# Patient Record
Sex: Female | Born: 1975 | Race: White | Hispanic: No | Marital: Single | Smoking: Current every day smoker
Health system: Southern US, Community
[De-identification: ages and names within clinical notes are randomized; demographics above are authoritative.]

## PROBLEM LIST (undated history)

## (undated) DIAGNOSIS — C539 Malignant neoplasm of cervix uteri, unspecified: Secondary | ICD-10-CM

## (undated) DIAGNOSIS — J45909 Unspecified asthma, uncomplicated: Secondary | ICD-10-CM

## (undated) HISTORY — PX: ARTERY REPAIR: SHX559

## (undated) HISTORY — PX: ABDOMINAL HYSTERECTOMY: SHX81

---

## 2001-02-25 ENCOUNTER — Encounter: Payer: Self-pay | Admitting: Emergency Medicine

## 2001-02-25 ENCOUNTER — Emergency Department (HOSPITAL_COMMUNITY): Admission: EM | Admit: 2001-02-25 | Discharge: 2001-02-26 | Payer: Self-pay | Admitting: Emergency Medicine

## 2001-07-10 ENCOUNTER — Emergency Department (HOSPITAL_COMMUNITY): Admission: EM | Admit: 2001-07-10 | Discharge: 2001-07-11 | Payer: Self-pay | Admitting: Emergency Medicine

## 2001-08-01 ENCOUNTER — Emergency Department (HOSPITAL_COMMUNITY): Admission: EM | Admit: 2001-08-01 | Discharge: 2001-08-01 | Payer: Self-pay | Admitting: Emergency Medicine

## 2006-07-28 ENCOUNTER — Ambulatory Visit (HOSPITAL_COMMUNITY): Admission: RE | Admit: 2006-07-28 | Discharge: 2006-07-28 | Payer: Self-pay | Admitting: Orthopedic Surgery

## 2006-09-10 ENCOUNTER — Ambulatory Visit (HOSPITAL_BASED_OUTPATIENT_CLINIC_OR_DEPARTMENT_OTHER): Admission: RE | Admit: 2006-09-10 | Discharge: 2006-09-10 | Payer: Self-pay | Admitting: Orthopedic Surgery

## 2006-09-10 ENCOUNTER — Encounter (INDEPENDENT_AMBULATORY_CARE_PROVIDER_SITE_OTHER): Payer: Self-pay | Admitting: Orthopedic Surgery

## 2010-06-05 NOTE — Op Note (Signed)
NAME:  Colleen Kim, Colleen Kim             ACCOUNT NO.:  [id number]   MEDICAL RECORD NO.:  [id number]          PATIENT TYPE:  AMB   LOCATION:  DSC                          FACILITY:  [facility]   PHYSICIAN:  Cindee Salt, M.D.       DATE OF BIRTH:  [birth year]   DATE OF PROCEDURE:  09/10/2006  DATE OF DISCHARGE:                               OPERATIVE REPORT   PREOPERATIVE DIAGNOSIS:  Carpal tunnel syndrome, left hand; volar radial  wrist ganglion, left wrist.   POSTOPERATIVE DIAGNOSIS:  Carpal tunnel syndrome, left hand; volar  radial wrist ganglion, left wrist.   OPERATION:  Excision volar radial wrist ganglion, release left carpal  tunnel.   SURGEON:  Cindee Salt, M.D.   ASSISTANT:  Carolyne Fiscal R.N.   ANESTHESIA:  General.   HISTORY:  The patient is a 35 year old female with a history of a mass  on the volar radial aspect of her wrist, carpal tunnel syndrome, EMG  nerve conductions positive, unresponsive to conservative treatment.  She  has elected to proceed to have the carpal tunnel release and cyst  excised.  She is aware of risks and complications including infection,  recurrence, injury to arteries, nerves, tendons, incomplete relief of  symptoms, dystrophy.  In the preoperative area the patient is seen,  questions encouraged and answered. extremity marked by both the patient  and surgeon.  Antibiotic given.   PROCEDURE:  The patient is brought to the operating room where a general  anesthetic was carried out without difficulty.  She was prepped using  DuraPrep, supine position, left arm free.  A longitudinal incision was  made in the palm, carried down through subcutaneous tissue.  Bleeders  were electrocauterized.  Palmar fascia was split, superficial palmar  arch identified, flexor tendon of the ring and little finger identified.  To the ulnar side of the median nerve, carpal retinaculum was incised  with sharp dissection, right angle and Sewall retractor were placed  between skin and  forearm fascia.  The fascia released for approximately  1.5 cm proximal to the wrist crease under direct vision.  The canal was  explored, found to be extremely tight, area of compression of the nerve  was apparent.  Tenosynovial tissue was moderately thickened.  No further  lesions were identified.  The wound was irrigated.  Skin was then closed  with interrupted 5-0 Vicryl Rapide sutures.  Separate incision was then  made over the volar radial aspect of the wrist, carried down through  subcutaneous tissue. A multilobulated cystic structure was immediately  encountered.  With blunt sharp dissection this was dissected free.  The  radial artery was protected as was the superficial branch.  This was  followed down to the flexor carpi radialis sheath to the STT joint.  This area was debrided.  The specimen was sent to pathology.  No further  lesions were identified.  The wound was irrigated.  Opening the capsule  the STT joint was closed with a figure-of-eight 5-0 Vicryl Rapide suture  and the skin closed with interrupted 5-0 Vicryl Rapide sutures.  Sterile  compressive dressing, wrist splint applied.  The patient tolerated  the procedure well.  On deflation of the tourniquet all fingers  immediately pinked.  She was taken to the recovery observation in  satisfactory condition.  She will be discharged home to return to the  [facility] in one week on Vicodin.           ______________________________  Cindee Salt, M.D.     GK/MEDQ  D:  09/10/2006  T:  09/11/2006  Job:  [id number]

## 2010-11-02 LAB — POCT HEMOGLOBIN-HEMACUE
Hemoglobin: 14.9
Operator id: 112821

## 2011-07-14 ENCOUNTER — Encounter (HOSPITAL_COMMUNITY): Payer: Self-pay | Admitting: Emergency Medicine

## 2011-07-14 ENCOUNTER — Emergency Department (HOSPITAL_COMMUNITY)
Admission: EM | Admit: 2011-07-14 | Discharge: 2011-07-14 | Disposition: A | Discharge status: Home / Self Care | Payer: Self-pay | Attending: Emergency Medicine | Admitting: Emergency Medicine

## 2011-07-14 DIAGNOSIS — F17200 Nicotine dependence, unspecified, uncomplicated: Secondary | ICD-10-CM | POA: Insufficient documentation

## 2011-07-14 DIAGNOSIS — M542 Cervicalgia: Secondary | ICD-10-CM | POA: Insufficient documentation

## 2011-07-14 DIAGNOSIS — J45909 Unspecified asthma, uncomplicated: Secondary | ICD-10-CM | POA: Insufficient documentation

## 2011-07-14 DIAGNOSIS — Z79899 Other long term (current) drug therapy: Secondary | ICD-10-CM | POA: Insufficient documentation

## 2011-07-14 DIAGNOSIS — E119 Type 2 diabetes mellitus without complications: Secondary | ICD-10-CM | POA: Insufficient documentation

## 2011-07-14 DIAGNOSIS — X58XXXA Exposure to other specified factors, initial encounter: Secondary | ICD-10-CM | POA: Insufficient documentation

## 2011-07-14 DIAGNOSIS — S161XXA Strain of muscle, fascia and tendon at neck level, initial encounter: Secondary | ICD-10-CM

## 2011-07-14 DIAGNOSIS — Z8541 Personal history of malignant neoplasm of cervix uteri: Secondary | ICD-10-CM | POA: Insufficient documentation

## 2011-07-14 HISTORY — DX: Malignant neoplasm of cervix uteri, unspecified: C53.9

## 2011-07-14 HISTORY — DX: Unspecified asthma, uncomplicated: J45.909

## 2011-07-14 MED ORDER — HYDROCODONE-ACETAMINOPHEN 5-325 MG PO TABS: 1.0000 | ORAL_TABLET | ORAL | Status: AC | PRN

## 2011-07-14 NOTE — ED Provider Notes (Signed)
Medical screening examination/treatment/procedure(s) were performed by non-physician practitioner and as supervising physician I was immediately available for consultation/collaboration.  Gabrial Domine, MD 07/14/11 0728 

## 2011-07-14 NOTE — ED Notes (Signed)
Pt reports having painful lump left side of head in back of ear onset time 1 yr

## 2011-07-14 NOTE — ED Provider Notes (Signed)
History     CSN: [id number]  Arrival date & time 07/14/11  0345   First MD Initiated Contact with Patient 07/14/11 0408      Chief Complaint  Patient presents with  . Mass    Left side head in back of ear onset 1 yr    HPI  History provided by the patient. Patient is a 36 year old female with history of diabetes and asthma who presents with complaints of pain and swelling behind her left ear and neck area. Patient reports having similar symptoms previously off-and-on for the past one year. Patient states she was told in the past that she may have a swollen lymph node to the area and during that time was given antibiotics. Today patient states symptoms have been worsening over the past 2 weeks. She reports that pain is worse with increased activity and some movements of her head and neck especially twisting to left. Patient has tried using Tylenol and ibuprofen for home without any relief. She's also use heat over the area without change. She denies any other aggravating or alleviating factors. She denies any earache, hearing loss, fever, chills, sweats, nausea vomiting.   Past Medical History  Diagnosis Date  . Diabetes mellitus   . Cervical cancer   . Asthma     Past Surgical History  Procedure Date  . Abdominal hysterectomy     History reviewed. No pertinent family history.  History  Substance Use Topics  . Smoking status: Current Everyday Smoker  . Smokeless tobacco: Not on file  . Alcohol Use: No    OB History    Grav Para Term Preterm Abortions TAB SAB Ect Mult Living                  Review of Systems  Constitutional: Negative for fever and chills.  HENT: Positive for neck pain. Negative for hearing loss, ear pain and ear discharge.   Gastrointestinal: Negative for nausea and vomiting.  Neurological: Positive for headaches. Negative for weakness and numbness.    Allergies  Review of patient's allergies indicates not on file.  Home Medications   Current  Outpatient Rx  Name Route Sig Dispense Refill  . ALBUTEROL SULFATE (2.5 MG/3ML) 0.083% IN NEBU Nebulization Take 2.5 mg by nebulization every 6 (six) hours as needed.    . IPRATROPIUM-ALBUTEROL 0.5-2.5 (3) MG/3ML IN SOLN Nebulization Take 3 mLs by nebulization every 4 (four) hours as needed.    Marland Kitchen METFORMIN HCL 1000 MG PO TABS Oral Take 1,000 mg by mouth 2 (two) times daily with a meal.      BP 138/77  Pulse 81  Temp 98.5 F (36.9 C) (Oral)  Resp 18  SpO2 98%  Physical Exam  Nursing note and vitals reviewed. Constitutional: She is oriented to person, place, and time. She appears well-developed and well-nourished. No distress.  HENT:  Head: Normocephalic and atraumatic.  Right Ear: External ear normal.  Left Ear: External ear normal.  Neck: Normal range of motion. Neck supple.       There is mild tenderness over the left mastoid process area with slight general swelling. There is no erythema of the skin. Skin is normal temperature. No small nodules or lymph nodes appreciated. Tenderness does extend slightly down sternocleidomastoid. Full range of motion. Normal carotid pulses. No carotid bruits.  Cardiovascular: Normal rate and regular rhythm.   Pulmonary/Chest: Effort normal and breath sounds normal. No stridor.  Neurological: She is alert and oriented to person, place, and time.  Skin: Skin is warm and dry. No rash noted.       Hair and scalp normal.  Psychiatric: She has a normal mood and affect. Her behavior is normal.    ED Course  Procedures    1. Strain of sternocleidomastoid muscle       MDM  Pt seen and evaluated. Patient no acute distress.        Angus Seller, [place] 07/14/11 [phone]

## 2011-07-14 NOTE — Discharge Instructions (Signed)
You were seen and evaluated for your symptoms for neck and head pains.  At this time your provider(s) feel your symptoms may be caused from muscle strain and soreness.  Use Rest, Ice, Compression and Elevation to help reduce pain and swelling.  Please follow up with your doctor for further evaluation and treatment.  Muscle Strain A muscle strain, or pulled muscle, occurs when a muscle is over-stretched. A small number of muscle fibers may also be torn. This is especially common in athletes. This happens when a sudden violent force placed on a muscle pushes it past its capacity. Usually, recovery from a pulled muscle takes 1 to 2 weeks. But complete healing will take 5 to 6 weeks. There are millions of muscle fibers. Following injury, your body will usually return to normal quickly. HOME CARE INSTRUCTIONS   While awake, apply ice to the sore muscle for 15 to 20 minutes each hour for the first 2 days. Put ice in a plastic bag and place a towel between the bag of ice and your skin.   Do not use the pulled muscle for several days. Do not use the muscle if you have pain.   You may wrap the injured area with an elastic bandage for comfort. Be careful not to bind it too tightly. This may interfere with blood circulation.   Only take over-the-counter or prescription medicines for pain, discomfort, or fever as directed by your caregiver. Do not use aspirin as this will increase bleeding (bruising) at injury site.   Warming up before exercise helps prevent muscle strains.  SEEK MEDICAL CARE IF:  There is increased pain or swelling in the affected area. MAKE SURE YOU:   Understand these instructions.   Will watch your condition.   Will get help right away if you are not doing well or get worse.  Document Released: 01/07/2005 Document Revised: 12/27/2010 Document Reviewed: 08/06/2006 [facility] Patient Information 2012 [place], [place].    RESOURCE GUIDE  Chronic Pain Problems: Contact [facility]  [phone] Patients need to be referred by their primary care doctor.  Insufficient Money for Medicine: Contact United Way:  call "211" or [facility] [phone].  No Primary Care Doctor: - Call Health Connect  [phone] - can help you locate a primary care doctor that  accepts your insurance, provides certain services, etc. - Physician Referral [phone]  Agencies that provide inexpensive medical care: - [facility]  [phone] - [facility]  [phone] - [facility]  [phone] [facility]  [phone] - [facility]  [phone] [facility]  [phone]  Medicaid-accepting [facility] Providers: - [facility]- [address]  [phone], Mon-Fri 9am-7pm, Sat 9am-1pm - [facility]- [address] [place], [address] [place]  [phone] - [facility]- [address] [place]  [phone] [facility]- [address]  [phone] - Renaye Rakers- [address] [place], [address], [phone]  Only accepts [place] patients after they have their name  applied to their card  Self Pay (no insurance) in [place]: - Sickle Cell Patients: Dr Willey Blade, [facility]  [address] [place], [phone] - [facility]- [address] [place]  [phone]       [facility] [place]- [address]       -  [facility]- see information above (Speak to [employer] if you do not have insurance)       -  [facility]- [address] [place], [phone]       -  [facility]- [address],  [phone]       -  [facility]- [address], [phone]       -  Dr Julio Sicks-  [address], [place], [phone]       -  [facility] Urgent Care- [address], [phone]       -  [facility]- [address] [phone], also [address] [phone]       -    [facility]- [address] [place], [phone], 1st & 3rd Saturday   every month, 10am-1pm  1) Find a Doctor and Pay Out of Pocket Although you won't have to find out who is covered by your insurance plan, it is a good idea to ask around and get recommendations. You will then need to call the office and see if the doctor you have chosen will accept you as a new patient and what types of options they offer for patients who are self-pay. Some doctors offer discounts or will set up payment plans for their patients who do not have insurance, but you will need to ask so you aren't surprised when you get to your appointment.  2) Contact Your Local Health Department Not all health departments have doctors that can see patients for sick visits, but many do, so it is worth a call to see if yours does. If you don't know where your local health department is, you can check in your phone book. The CDC also has a tool to help you locate your state's health department, and many state websites also have listings of all of their local health departments.  3) Find a Walk-in Clinic If your illness is not likely to be very severe or complicated, you may want to try a walk in clinic. These are popping up all over the country in pharmacies, drugstores, and shopping centers. They're usually staffed by nurse practitioners or physician assistants that have been trained to treat common illnesses and complaints. They're usually fairly quick and inexpensive. However, if you have serious medical issues or chronic medical problems, these are probably not your best option  STD Testing - [facility] Department of [facility] [place], STD Clinic, [address] [place], phone [phone] or [phone].  Monday - Friday, call for an appointment. [facility] Department of [employer], STD Clinic, [address], [place], phone [phone] or [phone].  Monday - Friday, call for an appointment.  Abuse/Neglect: [facility] Child Abuse Hotline ([phone] [facility] Child Abuse Hotline 414-329-9625 (After Hours)  Emergency Shelter:  [facility] (903)454-1050  Maternity Homes: - [facility] [phone] - [facility] 7577313991  MRSA Hotline #:   2237824312  [facility] Resources  [facility]  United Way [facility] Dept. [address]                 [address]         [address] [place] [address]  [address]  Cristobal Goldmann Phone:  [phone]                                  Phone:  [phone]                   Phone:  [phone]  [facility], [phone] - [phone]       -     [facility] in [place], [address]                                  [phone], [facility] Child Abuse Hotline 504 271 2983 or [phone] (After Hours)   Behavioral Health Services  Substance Abuse Resources: - [facility] Drug Services  818-400-9692 - [facility] [phone] - [facility] 6502785653 Floydene Flock 3342710738 - Residential & Outpatient Substance Abuse Program  [phone]  Psychological Services: [facility]  [phone] [facility] Services  [phone] - [facility], [phone] [place]. [address], [place], ACCESS LINE: 5300910970 or [phone], [employer].za  Dental Assistance  If unable to pay or uninsured, contact:  [facility] or [facility]. to become qualified for the adult dental clinic.  Patients with Medicaid: [facility] [phone] [address]  Joellyn Quails, [phone] [address] [phone]  If unable to pay, or uninsured, contact [facility] (303)[phone]) or [facility] Department [phone] in [place], [phone] in [facility]) to become qualified for the adult dental clinic  Other Low-Cost Community Dental Services: - [facility]- [address] [place], [place], [place], [address], [phone], [address]. [phone], 2nd and 4th Thursday of the month at 6:30am.  10 clients each day by appointment, can sometimes see walk-in patients if someone does not show for an appointment. [facility]- [address] Ether Griffins [place], [place], [address], [phone] - [facility]- [address] [place], [place], [address], [phone] - [facility]- [phone] [facility] Department- [phone] [facility] Department- [phone]
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
## 2013-07-22 ENCOUNTER — Emergency Department (HOSPITAL_COMMUNITY): Payer: Medicaid Other

## 2013-07-22 ENCOUNTER — Emergency Department (HOSPITAL_COMMUNITY)
Admission: EM | Admit: 2013-07-22 | Discharge: 2013-07-22 | Disposition: A | Discharge status: Home / Self Care | Payer: Medicaid Other | Attending: Emergency Medicine | Admitting: Emergency Medicine

## 2013-07-22 ENCOUNTER — Encounter (HOSPITAL_COMMUNITY): Payer: Self-pay | Admitting: Emergency Medicine

## 2013-07-22 DIAGNOSIS — Y929 Unspecified place or not applicable: Secondary | ICD-10-CM | POA: Insufficient documentation

## 2013-07-22 DIAGNOSIS — Y9389 Activity, other specified: Secondary | ICD-10-CM | POA: Insufficient documentation

## 2013-07-22 DIAGNOSIS — F17200 Nicotine dependence, unspecified, uncomplicated: Secondary | ICD-10-CM | POA: Diagnosis not present

## 2013-07-22 DIAGNOSIS — S46909A Unspecified injury of unspecified muscle, fascia and tendon at shoulder and upper arm level, unspecified arm, initial encounter: Secondary | ICD-10-CM | POA: Insufficient documentation

## 2013-07-22 DIAGNOSIS — Z79899 Other long term (current) drug therapy: Secondary | ICD-10-CM | POA: Diagnosis not present

## 2013-07-22 DIAGNOSIS — S4980XA Other specified injuries of shoulder and upper arm, unspecified arm, initial encounter: Secondary | ICD-10-CM | POA: Insufficient documentation

## 2013-07-22 DIAGNOSIS — Z8542 Personal history of malignant neoplasm of other parts of uterus: Secondary | ICD-10-CM | POA: Insufficient documentation

## 2013-07-22 DIAGNOSIS — E119 Type 2 diabetes mellitus without complications: Secondary | ICD-10-CM | POA: Insufficient documentation

## 2013-07-22 DIAGNOSIS — J45909 Unspecified asthma, uncomplicated: Secondary | ICD-10-CM | POA: Insufficient documentation

## 2013-07-22 DIAGNOSIS — S4991XA Unspecified injury of right shoulder and upper arm, initial encounter: Secondary | ICD-10-CM

## 2013-07-22 MED ORDER — HYDROCODONE-ACETAMINOPHEN 5-325 MG PO TABS: 1.0000 | ORAL_TABLET | ORAL | Status: DC | PRN
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
## 2013-07-22 NOTE — Discharge Instructions (Signed)
Take the prescribed medication as directed. Follow-up with orthopedics if no improvement in 1 week or if symptoms worsen. Return to the ED for new concerns.

## 2013-07-22 NOTE — ED Provider Notes (Signed)
Medical screening examination/treatment/procedure(s) were performed by non-physician practitioner and as supervising physician I was immediately available for consultation/collaboration.   EKG Interpretation None        Malvin Johns, MD 07/22/13 2319

## 2017-10-24 DIAGNOSIS — R0603 Acute respiratory distress: Secondary | ICD-10-CM

## 2017-10-24 DIAGNOSIS — K219 Gastro-esophageal reflux disease without esophagitis: Secondary | ICD-10-CM

## 2017-10-24 DIAGNOSIS — F1910 Other psychoactive substance abuse, uncomplicated: Secondary | ICD-10-CM

## 2017-10-24 DIAGNOSIS — Z720 Tobacco use: Secondary | ICD-10-CM

## 2017-10-24 DIAGNOSIS — J9690 Respiratory failure, unspecified, unspecified whether with hypoxia or hypercapnia: Secondary | ICD-10-CM

## 2017-10-24 DIAGNOSIS — J189 Pneumonia, unspecified organism: Secondary | ICD-10-CM

## 2017-10-29 DIAGNOSIS — S2239XA Fracture of one rib, unspecified side, initial encounter for closed fracture: Secondary | ICD-10-CM

## 2017-10-29 DIAGNOSIS — J9601 Acute respiratory failure with hypoxia: Secondary | ICD-10-CM

## 2017-10-29 DIAGNOSIS — E119 Type 2 diabetes mellitus without complications: Secondary | ICD-10-CM

## 2017-10-29 DIAGNOSIS — I10 Essential (primary) hypertension: Secondary | ICD-10-CM

## 2017-10-29 DIAGNOSIS — J869 Pyothorax without fistula: Secondary | ICD-10-CM

## 2017-10-29 DIAGNOSIS — I517 Cardiomegaly: Secondary | ICD-10-CM

## 2017-11-01 ENCOUNTER — Inpatient Hospital Stay (HOSPITAL_COMMUNITY)
Admission: AD | Admit: 2017-11-01 | Discharge: 2017-11-06 | DRG: 163 | Disposition: A | Discharge status: Home / Self Care | Payer: Self-pay | Source: Other Acute Inpatient Hospital | Attending: Internal Medicine | Admitting: Internal Medicine

## 2017-11-01 DIAGNOSIS — J869 Pyothorax without fistula: Principal | ICD-10-CM | POA: Diagnosis present

## 2017-11-01 DIAGNOSIS — J45909 Unspecified asthma, uncomplicated: Secondary | ICD-10-CM | POA: Diagnosis present

## 2017-11-01 DIAGNOSIS — Z09 Encounter for follow-up examination after completed treatment for conditions other than malignant neoplasm: Secondary | ICD-10-CM

## 2017-11-01 DIAGNOSIS — J90 Pleural effusion, not elsewhere classified: Secondary | ICD-10-CM | POA: Diagnosis present

## 2017-11-01 DIAGNOSIS — K5900 Constipation, unspecified: Secondary | ICD-10-CM | POA: Diagnosis present

## 2017-11-01 DIAGNOSIS — J9811 Atelectasis: Secondary | ICD-10-CM | POA: Diagnosis present

## 2017-11-01 DIAGNOSIS — F17200 Nicotine dependence, unspecified, uncomplicated: Secondary | ICD-10-CM | POA: Diagnosis present

## 2017-11-01 DIAGNOSIS — L8990 Pressure ulcer of unspecified site, unspecified stage: Secondary | ICD-10-CM

## 2017-11-01 DIAGNOSIS — E1165 Type 2 diabetes mellitus with hyperglycemia: Secondary | ICD-10-CM | POA: Diagnosis present

## 2017-11-01 DIAGNOSIS — Z23 Encounter for immunization: Secondary | ICD-10-CM

## 2017-11-01 DIAGNOSIS — Z888 Allergy status to other drugs, medicaments and biological substances status: Secondary | ICD-10-CM

## 2017-11-01 DIAGNOSIS — E119 Type 2 diabetes mellitus without complications: Secondary | ICD-10-CM

## 2017-11-01 DIAGNOSIS — Z8541 Personal history of malignant neoplasm of cervix uteri: Secondary | ICD-10-CM

## 2017-11-01 DIAGNOSIS — J189 Pneumonia, unspecified organism: Secondary | ICD-10-CM | POA: Diagnosis present

## 2017-11-01 DIAGNOSIS — I313 Pericardial effusion (noninflammatory): Secondary | ICD-10-CM | POA: Diagnosis present

## 2017-11-01 DIAGNOSIS — R590 Localized enlarged lymph nodes: Secondary | ICD-10-CM | POA: Diagnosis present

## 2017-11-01 DIAGNOSIS — Z881 Allergy status to other antibiotic agents status: Secondary | ICD-10-CM

## 2017-11-01 DIAGNOSIS — Z7984 Long term (current) use of oral hypoglycemic drugs: Secondary | ICD-10-CM

## 2017-11-01 DIAGNOSIS — Z9689 Presence of other specified functional implants: Secondary | ICD-10-CM

## 2017-11-01 DIAGNOSIS — Z882 Allergy status to sulfonamides status: Secondary | ICD-10-CM

## 2017-11-01 DIAGNOSIS — Z4682 Encounter for fitting and adjustment of non-vascular catheter: Secondary | ICD-10-CM

## 2017-11-01 DIAGNOSIS — R042 Hemoptysis: Secondary | ICD-10-CM | POA: Diagnosis present

## 2017-11-01 DIAGNOSIS — D62 Acute posthemorrhagic anemia: Secondary | ICD-10-CM | POA: Diagnosis present

## 2017-11-01 DIAGNOSIS — Z8249 Family history of ischemic heart disease and other diseases of the circulatory system: Secondary | ICD-10-CM

## 2017-11-01 DIAGNOSIS — B9620 Unspecified Escherichia coli [E. coli] as the cause of diseases classified elsewhere: Secondary | ICD-10-CM | POA: Diagnosis present

## 2017-11-01 DIAGNOSIS — L89812 Pressure ulcer of head, stage 2: Secondary | ICD-10-CM | POA: Diagnosis not present

## 2017-11-01 DIAGNOSIS — B9562 Methicillin resistant Staphylococcus aureus infection as the cause of diseases classified elsewhere: Secondary | ICD-10-CM | POA: Diagnosis present

## 2017-11-01 DIAGNOSIS — Z90710 Acquired absence of both cervix and uterus: Secondary | ICD-10-CM

## 2017-11-02 ENCOUNTER — Encounter (HOSPITAL_COMMUNITY)
Admission: AD | Disposition: A | Discharge status: Home / Self Care | Payer: Self-pay | Source: Other Acute Inpatient Hospital | Attending: Internal Medicine

## 2017-11-02 ENCOUNTER — Inpatient Hospital Stay (HOSPITAL_COMMUNITY): Payer: Self-pay

## 2017-11-02 ENCOUNTER — Inpatient Hospital Stay: Payer: Self-pay

## 2017-11-02 ENCOUNTER — Encounter (HOSPITAL_COMMUNITY): Payer: Self-pay

## 2017-11-02 ENCOUNTER — Inpatient Hospital Stay (HOSPITAL_COMMUNITY): Payer: Self-pay | Admitting: Certified Registered Nurse Anesthetist

## 2017-11-02 ENCOUNTER — Other Ambulatory Visit: Payer: Self-pay

## 2017-11-02 DIAGNOSIS — J869 Pyothorax without fistula: Secondary | ICD-10-CM | POA: Diagnosis present

## 2017-11-02 DIAGNOSIS — E119 Type 2 diabetes mellitus without complications: Secondary | ICD-10-CM

## 2017-11-02 DIAGNOSIS — J45909 Unspecified asthma, uncomplicated: Secondary | ICD-10-CM

## 2017-11-02 DIAGNOSIS — J188 Other pneumonia, unspecified organism: Secondary | ICD-10-CM

## 2017-11-02 HISTORY — PX: VIDEO BRONCHOSCOPY: SHX5072

## 2017-11-02 HISTORY — PX: VIDEO ASSISTED THORACOSCOPY (VATS)/EMPYEMA: SHX6172

## 2017-11-02 LAB — CBC
HCT: 36.2 % (ref 36.0–46.0)
HEMOGLOBIN: 11.5 g/dL — AB (ref 12.0–15.0)
MCH: 29.9 pg (ref 26.0–34.0)
MCHC: 31.8 g/dL (ref 30.0–36.0)
MCV: 94 fL (ref 80.0–100.0)
NRBC: 0 % (ref 0.0–0.2)
PLATELETS: 376 10*3/uL (ref 150–400)
RBC: 3.85 MIL/uL — AB (ref 3.87–5.11)
RDW: 12.7 % (ref 11.5–15.5)
WBC: 20.7 10*3/uL — ABNORMAL HIGH (ref 4.0–10.5)

## 2017-11-02 LAB — COMPREHENSIVE METABOLIC PANEL
ALBUMIN: 2.2 g/dL — AB (ref 3.5–5.0)
ALK PHOS: 150 U/L — AB (ref 38–126)
ALT: 71 U/L — ABNORMAL HIGH (ref 0–44)
ANION GAP: 11 (ref 5–15)
AST: 34 U/L (ref 15–41)
BILIRUBIN TOTAL: 0.3 mg/dL (ref 0.3–1.2)
BUN: 7 mg/dL (ref 6–20)
CALCIUM: 8.8 mg/dL — AB (ref 8.9–10.3)
CO2: 34 mmol/L — ABNORMAL HIGH (ref 22–32)
Chloride: 90 mmol/L — ABNORMAL LOW (ref 98–111)
Creatinine, Ser: 0.76 mg/dL (ref 0.44–1.00)
GFR calc Af Amer: >60 mL/min (ref >60)
GFR calc non Af Amer: >60 mL/min (ref >60)
GLUCOSE: 159 mg/dL — AB (ref 70–99)
Potassium: 4.1 mmol/L (ref 3.5–5.1)
Sodium: 135 mmol/L (ref 135–145)
TOTAL PROTEIN: 7.3 g/dL (ref 6.5–8.1)

## 2017-11-02 LAB — GLUCOSE, CAPILLARY
GLUCOSE-CAPILLARY: 163 mg/dL — AB (ref 70–99)
GLUCOSE-CAPILLARY: 317 mg/dL — AB (ref 70–99)
GLUCOSE-CAPILLARY: 342 mg/dL — AB (ref 70–99)
Glucose-Capillary: 186 mg/dL — ABNORMAL HIGH (ref 70–99)
Glucose-Capillary: 287 mg/dL — ABNORMAL HIGH (ref 70–99)

## 2017-11-02 LAB — HEMOGLOBIN A1C
Hgb A1c MFr Bld: 7.4 % — ABNORMAL HIGH (ref 4.8–5.6)
Mean Plasma Glucose: 165.68 mg/dL

## 2017-11-02 LAB — TYPE AND SCREEN
ABO/RH(D): A POS
Antibody Screen: NEGATIVE

## 2017-11-02 LAB — LACTATE DEHYDROGENASE: LDH: 146 U/L (ref 98–192)

## 2017-11-02 LAB — PROTIME-INR
INR: 1.12
Prothrombin Time: 14.3 seconds (ref 11.4–15.2)

## 2017-11-02 LAB — APTT: APTT: 41 s — AB (ref 24–36)

## 2017-11-02 LAB — MRSA PCR SCREENING: MRSA BY PCR: NEGATIVE

## 2017-11-02 LAB — ABO/RH: ABO/RH(D): A POS

## 2017-11-02 LAB — HIV ANTIBODY (ROUTINE TESTING W REFLEX): HIV Screen 4th Generation wRfx: NONREACTIVE

## 2017-11-02 SURGERY — VIDEO ASSISTED THORACOSCOPY (VATS)/EMPYEMA
Anesthesia: General | Site: Chest | Laterality: Right

## 2017-11-02 MED ORDER — ROCURONIUM BROMIDE 50 MG/5ML IV SOSY
PREFILLED_SYRINGE | INTRAVENOUS | Status: AC
  Filled 2017-11-02: qty 5

## 2017-11-02 MED ORDER — ONDANSETRON HCL 4 MG/2ML IJ SOLN: 4.0000 mg | Freq: Four times a day (QID) | INTRAMUSCULAR | Status: DC | PRN

## 2017-11-02 MED ORDER — FAMOTIDINE 20 MG PO TABS
20.0000 mg | ORAL_TABLET | Freq: Every day | ORAL | Status: DC
  Administered 2017-11-03 – 2017-11-06 (×4): 20 mg via ORAL
  Filled 2017-11-02 (×4): qty 1

## 2017-11-02 MED ORDER — NALOXONE HCL 0.4 MG/ML IJ SOLN: 0.4000 mg | INTRAMUSCULAR | Status: DC | PRN

## 2017-11-02 MED ORDER — SUGAMMADEX SODIUM 200 MG/2ML IV SOLN
INTRAVENOUS | Status: DC | PRN
  Administered 2017-11-02: 187 mg via INTRAVENOUS

## 2017-11-02 MED ORDER — ONDANSETRON HCL 4 MG/2ML IJ SOLN
INTRAMUSCULAR | Status: AC
  Filled 2017-11-02: qty 2

## 2017-11-02 MED ORDER — SODIUM CHLORIDE 0.9 % IV SOLN
INTRAVENOUS | Status: DC | PRN
  Administered 2017-11-02: 25 ug/min via INTRAVENOUS

## 2017-11-02 MED ORDER — SUCCINYLCHOLINE CHLORIDE 200 MG/10ML IV SOSY
PREFILLED_SYRINGE | INTRAVENOUS | Status: DC | PRN
  Administered 2017-11-02: 100 mg via INTRAVENOUS

## 2017-11-02 MED ORDER — SODIUM CHLORIDE 0.9% FLUSH
10.0000 mL | Freq: Two times a day (BID) | INTRAVENOUS | Status: DC
  Administered 2017-11-02 – 2017-11-03 (×2): 10 mL
  Administered 2017-11-03: 20 mL
  Administered 2017-11-04 – 2017-11-06 (×5): 10 mL

## 2017-11-02 MED ORDER — TRAMADOL HCL 50 MG PO TABS
50.0000 mg | ORAL_TABLET | Freq: Four times a day (QID) | ORAL | Status: DC | PRN
  Administered 2017-11-02: 100 mg via ORAL
  Administered 2017-11-06: 50 mg via ORAL
  Filled 2017-11-02 (×2): qty 2

## 2017-11-02 MED ORDER — PROPOFOL 10 MG/ML IV BOLUS
INTRAVENOUS | Status: DC | PRN
  Administered 2017-11-02: 120 mg via INTRAVENOUS

## 2017-11-02 MED ORDER — PHENYLEPHRINE 40 MCG/ML (10ML) SYRINGE FOR IV PUSH (FOR BLOOD PRESSURE SUPPORT)
PREFILLED_SYRINGE | INTRAVENOUS | Status: DC | PRN
  Administered 2017-11-02: 80 ug via INTRAVENOUS

## 2017-11-02 MED ORDER — FENTANYL 40 MCG/ML IV SOLN
INTRAVENOUS | Status: DC
  Administered 2017-11-02: 240 ug via INTRAVENOUS
  Administered 2017-11-02: 75 ug via INTRAVENOUS
  Administered 2017-11-02: 1000 ug via INTRAVENOUS
  Administered 2017-11-02: 225 ug via INTRAVENOUS
  Administered 2017-11-03: 90 ug via INTRAVENOUS
  Administered 2017-11-03: 75 ug via INTRAVENOUS
  Administered 2017-11-03: 1000 ug via INTRAVENOUS
  Administered 2017-11-03: 30 ug via INTRAVENOUS
  Administered 2017-11-03 – 2017-11-04 (×3): 150 ug via INTRAVENOUS
  Administered 2017-11-04: 135 ug via INTRAVENOUS
  Administered 2017-11-04: 1000 ug via INTRAVENOUS
  Administered 2017-11-04: 75 ug via INTRAVENOUS
  Administered 2017-11-04: 285 ug via INTRAVENOUS
  Administered 2017-11-04: 180 ug via INTRAVENOUS
  Administered 2017-11-04: 75 ug via INTRAVENOUS
  Administered 2017-11-05: 150 ug via INTRAVENOUS
  Filled 2017-11-02: qty 1000
  Filled 2017-11-02: qty 25
  Filled 2017-11-02: qty 1000

## 2017-11-02 MED ORDER — METOPROLOL TARTRATE 50 MG PO TABS
100.0000 mg | ORAL_TABLET | Freq: Two times a day (BID) | ORAL | Status: DC
  Administered 2017-11-02 – 2017-11-03 (×3): 100 mg via ORAL
  Filled 2017-11-02: qty 2
  Filled 2017-11-02: qty 1
  Filled 2017-11-02: qty 2

## 2017-11-02 MED ORDER — ALBUTEROL SULFATE (2.5 MG/3ML) 0.083% IN NEBU: 2.5000 mg | INHALATION_SOLUTION | Freq: Four times a day (QID) | RESPIRATORY_TRACT | Status: DC | PRN

## 2017-11-02 MED ORDER — SODIUM CHLORIDE 0.9% FLUSH: 9.0000 mL | INTRAVENOUS | Status: DC | PRN

## 2017-11-02 MED ORDER — LIDOCAINE 2% (20 MG/ML) 5 ML SYRINGE
INTRAMUSCULAR | Status: DC | PRN
  Administered 2017-11-02: 60 mg via INTRAVENOUS

## 2017-11-02 MED ORDER — KETOROLAC TROMETHAMINE 15 MG/ML IJ SOLN
15.0000 mg | Freq: Three times a day (TID) | INTRAMUSCULAR | Status: AC
  Administered 2017-11-02 – 2017-11-03 (×3): 15 mg via INTRAVENOUS
  Filled 2017-11-02 (×3): qty 1

## 2017-11-02 MED ORDER — PROPOFOL 10 MG/ML IV BOLUS
INTRAVENOUS | Status: AC
  Filled 2017-11-02: qty 20

## 2017-11-02 MED ORDER — ONDANSETRON HCL 4 MG/2ML IJ SOLN
INTRAMUSCULAR | Status: DC | PRN
  Administered 2017-11-02 (×2): 4 mg via INTRAVENOUS

## 2017-11-02 MED ORDER — INFLUENZA VAC SPLIT QUAD 0.5 ML IM SUSY
0.5000 mL | PREFILLED_SYRINGE | INTRAMUSCULAR | Status: AC
  Administered 2017-11-06: 0.5 mL via INTRAMUSCULAR
  Filled 2017-11-02: qty 0.5

## 2017-11-02 MED ORDER — DIPHENHYDRAMINE HCL 12.5 MG/5ML PO ELIX
12.5000 mg | ORAL_SOLUTION | Freq: Four times a day (QID) | ORAL | Status: DC | PRN
  Filled 2017-11-02: qty 5

## 2017-11-02 MED ORDER — FENTANYL CITRATE (PF) 250 MCG/5ML IJ SOLN
INTRAMUSCULAR | Status: DC | PRN
  Administered 2017-11-02 (×2): 50 ug via INTRAVENOUS
  Administered 2017-11-02: 100 ug via INTRAVENOUS
  Administered 2017-11-02: 50 ug via INTRAVENOUS

## 2017-11-02 MED ORDER — DIPHENHYDRAMINE HCL 50 MG/ML IJ SOLN
12.5000 mg | Freq: Four times a day (QID) | INTRAMUSCULAR | Status: DC | PRN
  Administered 2017-11-04 (×2): 12.5 mg via INTRAVENOUS
  Filled 2017-11-02 (×2): qty 1

## 2017-11-02 MED ORDER — PNEUMOCOCCAL VAC POLYVALENT 25 MCG/0.5ML IJ INJ
0.5000 mL | INJECTION | INTRAMUSCULAR | Status: AC
  Administered 2017-11-06: 0.5 mL via INTRAMUSCULAR
  Filled 2017-11-02: qty 0.5

## 2017-11-02 MED ORDER — ROCURONIUM BROMIDE 50 MG/5ML IV SOSY
PREFILLED_SYRINGE | INTRAVENOUS | Status: DC | PRN
  Administered 2017-11-02 (×2): 50 mg via INTRAVENOUS

## 2017-11-02 MED ORDER — DEXTROSE-NACL 5-0.9 % IV SOLN
INTRAVENOUS | Status: DC
  Administered 2017-11-02 – 2017-11-03 (×3): via INTRAVENOUS

## 2017-11-02 MED ORDER — BISACODYL 5 MG PO TBEC
10.0000 mg | DELAYED_RELEASE_TABLET | Freq: Every day | ORAL | Status: DC
  Administered 2017-11-03 – 2017-11-04 (×2): 10 mg via ORAL
  Filled 2017-11-02 (×4): qty 2

## 2017-11-02 MED ORDER — SODIUM CHLORIDE 0.9% FLUSH: 3.0000 mL | INTRAVENOUS | Status: DC | PRN

## 2017-11-02 MED ORDER — ACETAMINOPHEN 160 MG/5ML PO SOLN: 1000.0000 mg | Freq: Four times a day (QID) | ORAL | Status: DC

## 2017-11-02 MED ORDER — FENTANYL CITRATE (PF) 250 MCG/5ML IJ SOLN
INTRAMUSCULAR | Status: AC
  Filled 2017-11-02: qty 5

## 2017-11-02 MED ORDER — HEPARIN SODIUM (PORCINE) 5000 UNIT/ML IJ SOLN: 5000.0000 [IU] | Freq: Three times a day (TID) | INTRAMUSCULAR | Status: DC

## 2017-11-02 MED ORDER — MIDAZOLAM HCL 5 MG/5ML IJ SOLN
INTRAMUSCULAR | Status: DC | PRN
  Administered 2017-11-02: 2 mg via INTRAVENOUS

## 2017-11-02 MED ORDER — LIDOCAINE 2% (20 MG/ML) 5 ML SYRINGE
INTRAMUSCULAR | Status: AC
  Filled 2017-11-02: qty 5

## 2017-11-02 MED ORDER — ZOLPIDEM TARTRATE 5 MG PO TABS
5.0000 mg | ORAL_TABLET | Freq: Every evening | ORAL | Status: DC | PRN
  Administered 2017-11-02: 5 mg via ORAL
  Filled 2017-11-02: qty 1

## 2017-11-02 MED ORDER — MIDAZOLAM HCL 2 MG/2ML IJ SOLN
INTRAMUSCULAR | Status: AC
  Filled 2017-11-02: qty 2

## 2017-11-02 MED ORDER — FAMOTIDINE 20 MG PO TABS
20.0000 mg | ORAL_TABLET | Freq: Once | ORAL | Status: AC
  Administered 2017-11-02: 20 mg via ORAL
  Filled 2017-11-02: qty 1

## 2017-11-02 MED ORDER — SUCCINYLCHOLINE CHLORIDE 200 MG/10ML IV SOSY
PREFILLED_SYRINGE | INTRAVENOUS | Status: AC
  Filled 2017-11-02: qty 10

## 2017-11-02 MED ORDER — IPRATROPIUM-ALBUTEROL 0.5-2.5 (3) MG/3ML IN SOLN
3.0000 mL | Freq: Four times a day (QID) | RESPIRATORY_TRACT | Status: DC
  Administered 2017-11-02 – 2017-11-03 (×4): 3 mL via RESPIRATORY_TRACT
  Filled 2017-11-02 (×5): qty 3

## 2017-11-02 MED ORDER — VANCOMYCIN HCL 10 G IV SOLR
1250.0000 mg | Freq: Two times a day (BID) | INTRAVENOUS | Status: DC
  Administered 2017-11-02 – 2017-11-06 (×8): 1250 mg via INTRAVENOUS
  Filled 2017-11-02 (×9): qty 1250

## 2017-11-02 MED ORDER — FENTANYL CITRATE (PF) 100 MCG/2ML IJ SOLN
INTRAMUSCULAR | Status: AC
  Administered 2017-11-02: 50 ug via INTRAVENOUS
  Filled 2017-11-02: qty 2

## 2017-11-02 MED ORDER — ALBUTEROL SULFATE HFA 108 (90 BASE) MCG/ACT IN AERS
INHALATION_SPRAY | RESPIRATORY_TRACT | Status: DC | PRN
  Administered 2017-11-02: 4 via RESPIRATORY_TRACT
  Administered 2017-11-02: 2 via RESPIRATORY_TRACT

## 2017-11-02 MED ORDER — SODIUM CHLORIDE 0.9% FLUSH
3.0000 mL | Freq: Two times a day (BID) | INTRAVENOUS | Status: DC
  Administered 2017-11-02: 3 mL via INTRAVENOUS

## 2017-11-02 MED ORDER — 0.9 % SODIUM CHLORIDE (POUR BTL) OPTIME
TOPICAL | Status: DC | PRN
  Administered 2017-11-02: 3000 mL

## 2017-11-02 MED ORDER — LACTATED RINGERS IV SOLN
INTRAVENOUS | Status: DC | PRN
  Administered 2017-11-02: 09:00:00 via INTRAVENOUS

## 2017-11-02 MED ORDER — INSULIN ASPART 100 UNIT/ML ~~LOC~~ SOLN
0.0000 [IU] | SUBCUTANEOUS | Status: DC
  Administered 2017-11-02: 16 [IU] via SUBCUTANEOUS
  Administered 2017-11-02: 12 [IU] via SUBCUTANEOUS
  Administered 2017-11-02: 16 [IU] via SUBCUTANEOUS
  Administered 2017-11-03: 4 [IU] via SUBCUTANEOUS
  Administered 2017-11-03: 8 [IU] via SUBCUTANEOUS

## 2017-11-02 MED ORDER — SODIUM CHLORIDE 0.9% FLUSH: 10.0000 mL | INTRAVENOUS | Status: DC | PRN

## 2017-11-02 MED ORDER — POTASSIUM CHLORIDE 10 MEQ/50ML IV SOLN: 10.0000 meq | Freq: Every day | INTRAVENOUS | Status: DC | PRN

## 2017-11-02 MED ORDER — SODIUM CHLORIDE 0.9 % IV SOLN: 250.0000 mL | INTRAVENOUS | Status: DC | PRN

## 2017-11-02 MED ORDER — HYDROCODONE-ACETAMINOPHEN 5-325 MG PO TABS
1.0000 | ORAL_TABLET | ORAL | Status: DC | PRN
  Administered 2017-11-02: 1 via ORAL
  Filled 2017-11-02: qty 1

## 2017-11-02 MED ORDER — VANCOMYCIN HCL 10 G IV SOLR
1500.0000 mg | Freq: Once | INTRAVENOUS | Status: DC
  Filled 2017-11-02: qty 1500

## 2017-11-02 MED ORDER — OXYCODONE HCL 5 MG PO TABS: 5.0000 mg | ORAL_TABLET | Freq: Once | ORAL | Status: DC | PRN

## 2017-11-02 MED ORDER — ALBUMIN HUMAN 5 % IV SOLN
INTRAVENOUS | Status: DC | PRN
  Administered 2017-11-02: 10:00:00 via INTRAVENOUS

## 2017-11-02 MED ORDER — SENNOSIDES-DOCUSATE SODIUM 8.6-50 MG PO TABS
1.0000 | ORAL_TABLET | Freq: Every day | ORAL | Status: DC
  Administered 2017-11-02 – 2017-11-03 (×2): 1 via ORAL
  Filled 2017-11-02 (×2): qty 1

## 2017-11-02 MED ORDER — PIPERACILLIN-TAZOBACTAM 3.375 G IVPB
3.3750 g | Freq: Three times a day (TID) | INTRAVENOUS | Status: DC
  Administered 2017-11-02 – 2017-11-06 (×13): 3.375 g via INTRAVENOUS
  Filled 2017-11-02 (×17): qty 50

## 2017-11-02 MED ORDER — PHENYLEPHRINE 40 MCG/ML (10ML) SYRINGE FOR IV PUSH (FOR BLOOD PRESSURE SUPPORT)
PREFILLED_SYRINGE | INTRAVENOUS | Status: AC
  Filled 2017-11-02: qty 10

## 2017-11-02 MED ORDER — DEXAMETHASONE SODIUM PHOSPHATE 10 MG/ML IJ SOLN
INTRAMUSCULAR | Status: DC | PRN
  Administered 2017-11-02: 10 mg via INTRAVENOUS

## 2017-11-02 MED ORDER — FENTANYL CITRATE (PF) 100 MCG/2ML IJ SOLN
INTRAMUSCULAR | Status: AC
  Administered 2017-11-02: 25 ug via INTRAVENOUS
  Filled 2017-11-02: qty 2

## 2017-11-02 MED ORDER — INSULIN ASPART 100 UNIT/ML ~~LOC~~ SOLN
0.0000 [IU] | SUBCUTANEOUS | Status: DC
  Administered 2017-11-02: 2 [IU] via SUBCUTANEOUS

## 2017-11-02 MED ORDER — ACETAMINOPHEN 500 MG PO TABS
1000.0000 mg | ORAL_TABLET | Freq: Four times a day (QID) | ORAL | Status: DC
  Administered 2017-11-02 – 2017-11-06 (×16): 1000 mg via ORAL
  Filled 2017-11-02 (×16): qty 2

## 2017-11-02 MED ORDER — DEXAMETHASONE SODIUM PHOSPHATE 10 MG/ML IJ SOLN
INTRAMUSCULAR | Status: AC
  Filled 2017-11-02: qty 1

## 2017-11-02 MED ORDER — MONTELUKAST SODIUM 10 MG PO TABS
10.0000 mg | ORAL_TABLET | Freq: Every day | ORAL | Status: DC
  Administered 2017-11-02 – 2017-11-05 (×5): 10 mg via ORAL
  Filled 2017-11-02 (×5): qty 1

## 2017-11-02 MED ORDER — OXYCODONE HCL 5 MG/5ML PO SOLN: 5.0000 mg | Freq: Once | ORAL | Status: DC | PRN

## 2017-11-02 MED ORDER — OXYCODONE HCL 5 MG PO TABS
5.0000 mg | ORAL_TABLET | ORAL | Status: DC | PRN
  Administered 2017-11-02 – 2017-11-05 (×9): 10 mg via ORAL
  Administered 2017-11-05 – 2017-11-06 (×3): 5 mg via ORAL
  Filled 2017-11-02 (×4): qty 2
  Filled 2017-11-02: qty 1
  Filled 2017-11-02 (×3): qty 2
  Filled 2017-11-02: qty 1
  Filled 2017-11-02 (×2): qty 2
  Filled 2017-11-02: qty 1

## 2017-11-02 MED ORDER — BENZONATATE 100 MG PO CAPS
100.0000 mg | ORAL_CAPSULE | Freq: Three times a day (TID) | ORAL | Status: DC | PRN
  Administered 2017-11-03 – 2017-11-06 (×6): 100 mg via ORAL
  Filled 2017-11-02 (×6): qty 1

## 2017-11-02 MED ORDER — FENTANYL CITRATE (PF) 100 MCG/2ML IJ SOLN
25.0000 ug | INTRAMUSCULAR | Status: DC | PRN
  Administered 2017-11-02: 25 ug via INTRAVENOUS
  Administered 2017-11-02: 50 ug via INTRAVENOUS
  Administered 2017-11-02: 25 ug via INTRAVENOUS
  Administered 2017-11-02: 50 ug via INTRAVENOUS

## 2017-11-02 SURGICAL SUPPLY — 82 items
ADAPTER VALVE BIOPSY EBUS (MISCELLANEOUS) ×2 IMPLANT
ADPTR VALVE BIOPSY EBUS (MISCELLANEOUS) ×1
APPLICATOR TIP COSEAL (VASCULAR PRODUCTS) IMPLANT
APPLICATOR TIP EXT COSEAL (VASCULAR PRODUCTS) IMPLANT
BLADE SURG 11 STRL SS (BLADE) ×3 IMPLANT
CANISTER SUCT 3000ML PPV (MISCELLANEOUS) ×3 IMPLANT
CATH KIT ON Q 5IN SLV (PAIN MANAGEMENT) IMPLANT
CATH THORACIC 28FR (CATHETERS) IMPLANT
CATH THORACIC 36FR (CATHETERS) IMPLANT
CATH THORACIC 36FR RT ANG (CATHETERS) IMPLANT
CLEANER TIP ELECTROSURG 2X2 (MISCELLANEOUS) ×3 IMPLANT
CLIP VESOCCLUDE MED 6/CT (CLIP) IMPLANT
CONN ST 1/4X3/8  BEN (MISCELLANEOUS) ×1
CONN ST 1/4X3/8 BEN (MISCELLANEOUS) ×2 IMPLANT
CONN Y 3/8X3/8X3/8  BEN (MISCELLANEOUS)
CONN Y 3/8X3/8X3/8 BEN (MISCELLANEOUS) IMPLANT
CONT SPEC 4OZ CLIKSEAL STRL BL (MISCELLANEOUS) ×6 IMPLANT
COVER SURGICAL LIGHT HANDLE (MISCELLANEOUS) ×3 IMPLANT
COVER WAND RF STERILE (DRAPES) ×6 IMPLANT
DERMABOND ADVANCED (GAUZE/BANDAGES/DRESSINGS)
DERMABOND ADVANCED .7 DNX12 (GAUZE/BANDAGES/DRESSINGS) IMPLANT
DRAIN CHANNEL 28F RND 3/8 FF (WOUND CARE) ×6 IMPLANT
DRAPE LAPAROSCOPIC ABDOMINAL (DRAPES) ×3 IMPLANT
DRAPE WARM FLUID 44X44 (DRAPE) IMPLANT
DRILL BIT 7/64X5 (BIT) IMPLANT
ELECT BLADE 4.0 EZ CLEAN MEGAD (MISCELLANEOUS) ×3
ELECT BLADE 6.5 EXT (BLADE) ×3 IMPLANT
ELECT REM PT RETURN 9FT ADLT (ELECTROSURGICAL) ×3
ELECTRODE BLDE 4.0 EZ CLN MEGD (MISCELLANEOUS) ×2 IMPLANT
ELECTRODE REM PT RTRN 9FT ADLT (ELECTROSURGICAL) ×2 IMPLANT
GAUZE SPONGE 4X4 12PLY STRL (GAUZE/BANDAGES/DRESSINGS) ×3 IMPLANT
GLOVE BIO SURGEON STRL SZ 6.5 (GLOVE) ×18 IMPLANT
GLOVE BIOGEL PI IND STRL 6 (GLOVE) ×8 IMPLANT
GLOVE BIOGEL PI INDICATOR 6 (GLOVE) ×4
GOWN STRL REUS W/ TWL LRG LVL3 (GOWN DISPOSABLE) ×8 IMPLANT
GOWN STRL REUS W/TWL LRG LVL3 (GOWN DISPOSABLE) ×4
KIT BASIN OR (CUSTOM PROCEDURE TRAY) ×3 IMPLANT
KIT SUCTION CATH 14FR (SUCTIONS) ×6 IMPLANT
KIT TURNOVER KIT B (KITS) ×3 IMPLANT
NS IRRIG 1000ML POUR BTL (IV SOLUTION) ×12 IMPLANT
PACK CHEST (CUSTOM PROCEDURE TRAY) ×3 IMPLANT
PAD ARMBOARD 7.5X6 YLW CONV (MISCELLANEOUS) ×6 IMPLANT
PASSER SUT SWANSON 36MM LOOP (INSTRUMENTS) IMPLANT
PENCIL BUTTON HOLSTER BLD 10FT (ELECTRODE) ×3 IMPLANT
SCISSORS LAP 5X35 DISP (ENDOMECHANICALS) IMPLANT
SEALANT PROGEL (MISCELLANEOUS) IMPLANT
SEALANT SURG COSEAL 4ML (VASCULAR PRODUCTS) IMPLANT
SEALANT SURG COSEAL 8ML (VASCULAR PRODUCTS) IMPLANT
SOLUTION ANTI FOG 6CC (MISCELLANEOUS) ×3 IMPLANT
SUT PROLENE 3 0 SH DA (SUTURE) IMPLANT
SUT PROLENE 4 0 RB 1 (SUTURE)
SUT PROLENE 4-0 RB1 .5 CRCL 36 (SUTURE) IMPLANT
SUT SILK  1 MH (SUTURE) ×4
SUT SILK 1 MH (SUTURE) ×8 IMPLANT
SUT SILK 1 TIES 10X30 (SUTURE) IMPLANT
SUT SILK 2 0SH CR/8 30 (SUTURE) IMPLANT
SUT SILK 3 0SH CR/8 30 (SUTURE) IMPLANT
SUT VIC AB 1 CTX 18 (SUTURE) ×3 IMPLANT
SUT VIC AB 1 CTX 36 (SUTURE)
SUT VIC AB 1 CTX36XBRD ANBCTR (SUTURE) IMPLANT
SUT VIC AB 2-0 CTX 36 (SUTURE) IMPLANT
SUT VIC AB 3-0 X1 27 (SUTURE) IMPLANT
SUT VICRYL 0 UR6 27IN ABS (SUTURE) IMPLANT
SUT VICRYL 2 TP 1 (SUTURE) IMPLANT
SWAB COLLECTION DEVICE MRSA (MISCELLANEOUS) IMPLANT
SWAB CULTURE ESWAB REG 1ML (MISCELLANEOUS) IMPLANT
SYSTEM SAHARA CHEST DRAIN ATS (WOUND CARE) ×3 IMPLANT
TAPE CLOTH 4X10 WHT NS (GAUZE/BANDAGES/DRESSINGS) ×3 IMPLANT
TAPE CLOTH SURG 4X10 WHT LF (GAUZE/BANDAGES/DRESSINGS) ×3 IMPLANT
TAPE UMBILICAL COTTON 1/8X30 (MISCELLANEOUS) IMPLANT
TIP APPLICATOR SPRAY EXTEND 16 (VASCULAR PRODUCTS) IMPLANT
TOWEL GREEN STERILE (TOWEL DISPOSABLE) ×3 IMPLANT
TOWEL GREEN STERILE FF (TOWEL DISPOSABLE) ×3 IMPLANT
TRAP SPECIMEN MUCOUS 40CC (MISCELLANEOUS) ×9 IMPLANT
TRAY FOLEY CATH SILVER 16FR LF (SET/KITS/TRAYS/PACK) ×3 IMPLANT
TROCAR BLADELESS 11MM (ENDOMECHANICALS) ×3 IMPLANT
TROCAR BLADELESS 12MM (ENDOMECHANICALS) ×3 IMPLANT
TROCAR XCEL BLADELESS 5X75MML (TROCAR) ×3 IMPLANT
TUBE CONNECTING 20X1/4 (TUBING) ×3 IMPLANT
TUNNELER SHEATH ON-Q 11GX8 DSP (PAIN MANAGEMENT) IMPLANT
VALVE SUCTION BRONCHIO DISP (MISCELLANEOUS) ×3 IMPLANT
WATER STERILE IRR 1000ML POUR (IV SOLUTION) ×6 IMPLANT

## 2017-11-02 NOTE — Anesthesia Preprocedure Evaluation (Signed)
Anesthesia Evaluation  Patient identified by MRN, date of birth, ID band Patient awake    Reviewed: Allergy & Precautions, H&P , NPO status , Patient's Chart, lab work & pertinent test results  Airway Mallampati: II   Neck ROM: full    Dental   Pulmonary asthma , Current Smoker,  empyema    + decreased breath sounds      Cardiovascular negative cardio ROS   Rhythm:regular Rate:Normal     Neuro/Psych    GI/Hepatic   Endo/Other  diabetes, Type 2obese  Renal/GU      Musculoskeletal   Abdominal   Peds  Hematology   Anesthesia Other Findings   Reproductive/Obstetrics                             Anesthesia Physical Anesthesia Plan  ASA: II  Anesthesia Plan: General   Post-op Pain Management:    Induction: Intravenous  PONV Risk Score and Plan: 2 and Ondansetron, Dexamethasone, Midazolam and Treatment may vary due to age or medical condition  Airway Management Planned: Double Lumen EBT  Additional Equipment: Arterial line, CVP and Ultrasound Guidance Line Placement  Intra-op Plan:   Post-operative Plan:   Informed Consent: I have reviewed the patients History and Physical, chart, labs and discussed the procedure including the risks, benefits and alternatives for the proposed anesthesia with the patient or authorized representative who has indicated his/her understanding and acceptance.     Plan Discussed with: CRNA, Anesthesiologist and Surgeon  Anesthesia Plan Comments:         Anesthesia Quick Evaluation

## 2017-11-02 NOTE — Progress Notes (Signed)
Spoke with Delsa Sale, RN to coordinate PICC placement. Patient is NPO, has no IV access, and able to sign consent. PICC to be placed this am.

## 2017-11-02 NOTE — Progress Notes (Signed)
Arrived on unit to place PICC; patient is now in the OR. Will check back later to reassess PICC need and vascular access.

## 2017-11-02 NOTE — Anesthesia Postprocedure Evaluation (Signed)
Anesthesia Post Note  Patient: Colleen Kim  Procedure(s) Performed: VIDEO ASSISTED THORACOSCOPY Right(VATS)/DRAINAGE EMPYEMA AND DECORticaATION. with Electronic Lawernce Keas done for count verification (Right Chest) VIDEO BRONCHOSCOPY (N/A Chest)     Patient location during evaluation: PACU Anesthesia Type: General Level of consciousness: awake and alert Pain management: pain level controlled Vital Signs Assessment: post-procedure vital signs reviewed and stable Respiratory status: spontaneous breathing, nonlabored ventilation, respiratory function stable and patient connected to nasal cannula oxygen Cardiovascular status: blood pressure returned to baseline and stable Postop Assessment: no apparent nausea or vomiting Anesthetic complications: no    Last Vitals:  Vitals:   11/02/17 1600 11/02/17 1610  BP:    Pulse:    Resp: 18 18  Temp:    SpO2: 98% 96%    Last Pain:  Vitals:   11/02/17 1610  TempSrc:   PainSc: 10-Worst pain ever                 [name] S

## 2017-11-02 NOTE — Progress Notes (Signed)
Spoke with Dr. Maudie Mercury.  Notified of difficult Iv start with 5 attempts between 2 IV RN. Recommend picc as opposed to midline due to veins.

## 2017-11-02 NOTE — Anesthesia Procedure Notes (Signed)
Procedure Name: Intubation Date/Time: 11/02/2017 9:28 AM Performed by: Renato Shin, CRNA Pre-anesthesia Checklist: Patient identified, Emergency Drugs available and Suction available Patient Re-evaluated:Patient Re-evaluated prior to induction Oxygen Delivery Method: Circle system utilized Preoxygenation: Pre-oxygenation with 100% oxygen Induction Type: IV induction Ventilation: Mask ventilation without difficulty Laryngoscope Size: Miller and 3 Grade View: Grade I Tube type: Oral Tube size: 8.0 mm Number of attempts: 1 Airway Equipment and Method: Stylet Placement Confirmation: ETT inserted through vocal cords under direct vision,  positive ETCO2,  CO2 detector and breath sounds checked- equal and bilateral Secured at: 21 cm Tube secured with: Tape Dental Injury: Teeth and Oropharynx as per pre-operative assessment

## 2017-11-02 NOTE — H&P (Addendum)
TRH H&P   Patient Demographics:    Colleen Kim, is a 42 y.o. female  MRN: [id number]   DOB - 09-25-1975  Admit Date - 11/01/2017  Outpatient Primary MD for the patient is Patient, No Pcp Per  Referring MD/NP/PA:  Anda Latina  Outpatient Specialists:    Patient coming from: [facility]  No chief complaint on file.  Empyema   HPI:    Colleen Kim  is a 42 y.o. female, w dm2, Asthma apparently admitted to [facility] for CAP on 10/24/2017, tx with Rocephin and Zithromax and started on steroids for her asthma.    CXR 10/24/2017  Impression Small bilateral pleural effusiona dn patchy basilar opacities which may represent associated atelectasis or pneumonia.   CT chest 10/24/2017 Impression No PE Pleural effusion bilaterally larger on the right than on the left.  Areas of lower lobe atelectatic change bilatyerally with consolidation in portions of each lower lobe more on the right than on the left in hthe ineferior lingula, R middle lobe atelectasis.  Fracture of the lateral right sixth and seventh ribs, no pneumothorax evident Mildly prominent lymph nodes of uncertain etiology present.     Her steroids were stopped on 10/27/2017.   Repeat CXR 10/28/2017 Impression pelural effusions appear larger than on recent studies.  There is progression of airspace consolidation in the mid and lower lung zones likely due to alveolar edema although pneumonia superimposed cannot be excluded.  There is underlying pulmonary vascular congestion.  This combination of finding may well be indictaive of progression of CHF.   Pt was changed from rocephin to zosyn on 10/29/2017.    Blood cultures 10/24/2017 negative Respiratory viral panel negative.    Repeat CT chest 10/31/2017 Impression Bilateral pleural effusion larger on right, right appearing partially loculated and  question left demonstrating partial loculation as well.   Subtotal atelectasis of bilateral lower lobes and right middle lobe  Mediastinal and suspected right hilar adenopathy, minimally more prominent on the previously exam  Minimal pericardial effusion.    11/01/2017 Wbc 17.6, Hgb 11.8, Plt 317  Na 131, K 4.0, Bun 11, Creatinine 0.60    Apparently due to lack of improvement and concern for empyema Dr. Anda Latina discussed case with pulmonary who recommended CT surgery consult and then discussed with CT surgery who recommended transfer to [facility] for further evaluation.     Review of systems:    In addition to the HPI above,   No Headache, No changes with Vision or hearing, No problems swallowing food or Liquids, + Chest pain (ribs), + Shortness of Breath, No Abdominal pain, No Nausea or Vommitting, Bowel movements are regular, No Blood in stool or Urine, No dysuria, No new skin rashes or bruises, No new joints pains-aches,  No new weakness, tingling, numbness in any extremity, No recent weight gain or loss, No polyuria, polydypsia or polyphagia, No significant  Mental Stressors.  A full 10 point Review of Systems was done, except as stated above, all other Review of Systems were negative.   With Past History of the following :    Past Medical History:  Diagnosis Date  . Asthma   . Cervical cancer ([place])   . Diabetes mellitus       Past Surgical History:  Procedure Laterality Date  . ABDOMINAL HYSTERECTOMY    . ARTERY REPAIR Right    Right Ankle       Social History:     Social History   Tobacco Use  . Smoking status: Current Every Day Smoker    Packs/day: 1.00    Types: Cigarettes  . Smokeless tobacco: Never Used  Substance Use Topics  . Alcohol use: No     Lives - at home  Mobility - walks by self   Family History :     Family History  Problem Relation Age of Onset  . Hypertension Mother   . Heart failure Father        Home Medications:    Prior to Admission medications   Medication Sig Start Date End Date Taking? Authorizing Provider  acetaminophen (TYLENOL) 500 MG tablet Take 1,000 mg by mouth every 6 (six) hours as needed. For pain    [provider]  albuterol (PROVENTIL HFA;VENTOLIN HFA) 108 (90 BASE) MCG/ACT inhaler Inhale 2 puffs into the lungs every 6 (six) hours as needed. For shortness of breath    [provider]  albuterol (PROVENTIL) (2.5 MG/3ML) 0.083% nebulizer solution Take 2.5 mg by nebulization every 6 (six) hours as needed. For shortness of breath    [provider]  HYDROcodone-acetaminophen (NORCO/VICODIN) 5-325 MG per tablet Take 1 tablet by mouth every 4 (four) hours as needed. 07/22/13   Larene Pickett, PA-C  ipratropium-albuterol (DUONEB) 0.5-2.5 (3) MG/3ML SOLN Take 3 mLs by nebulization every 4 (four) hours as needed. For shortness of breath    [provider]  metFORMIN (GLUCOPHAGE) 1000 MG tablet Take 1,000 mg by mouth 2 (two) times daily with a meal.    [provider]     Allergies:     Allergies  Allergen Reactions  . Sulfa Antibiotics Hives     Physical Exam:   Vitals  Blood pressure 103/61, pulse 77, temperature 99.9 F (37.7 C), temperature source Oral, resp. rate 20.   1. General  lying in bed in NAD,   2. Normal affect and insight, Not Suicidal or Homicidal, Awake Alert, Oriented X 3.  3. No F.N deficits, ALL C.Nerves Intact, Strength 5/5 all 4 extremities, Sensation intact all 4 extremities, Plantars down going.  4. Ears and Eyes appear Normal, Conjunctivae clear, PERRLA. Moist Oral Mucosa.  5. Supple Neck, No JVD, No cervical lymphadenopathy appriciated, No Carotid Bruits.  6. Symmetrical Chest wall movement, Decrease in breath sounds right >left lung base, slight bibasilar crackles. No wheezing  7. RRR, No Gallops, Rubs or Murmurs, No Parasternal Heave.  8. Positive Bowel Sounds, Abdomen Soft, No tenderness, No organomegaly  appriciated,No rebound -guarding or rigidity.  9.  No Cyanosis, Normal Skin Turgor, No Skin Rash or Bruise.  10. Good muscle tone,  joints appear normal , no effusions, Normal ROM.  11. No Palpable Lymph Nodes in Neck or Axillae     Data Review:    CBC No results for input(s): WBC, HGB, HCT, PLT, MCV, MCH, MCHC, RDW, LYMPHSABS, MONOABS, EOSABS, BASOSABS, BANDABS in the last 168 hours.  Invalid input(s): NEUTRABS, BANDSABD ------------------------------------------------------------------------------------------------------------------  Chemistries  No results for input(s): NA, K, CL, CO2, GLUCOSE, BUN, CREATININE, CALCIUM, MG, AST, ALT, ALKPHOS, BILITOT in the last 168 hours.  Invalid input(s): GFRCGP ------------------------------------------------------------------------------------------------------------------ CrCl cannot be calculated (No successful lab value found.). ------------------------------------------------------------------------------------------------------------------ No results for input(s): TSH, T4TOTAL, T3FREE, THYROIDAB in the last 72 hours.  Invalid input(s): FREET3  Coagulation profile No results for input(s): INR, PROTIME in the last 168 hours. ------------------------------------------------------------------------------------------------------------------- No results for input(s): DDIMER in the last 72 hours. -------------------------------------------------------------------------------------------------------------------  Cardiac Enzymes No results for input(s): CKMB, TROPONINI, MYOGLOBIN in the last 168 hours.  Invalid input(s): CK ------------------------------------------------------------------------------------------------------------------ No results found for: BNP   ---------------------------------------------------------------------------------------------------------------  Urinalysis No results found for: COLORURINE, APPEARANCEUR,  LABSPEC, [place], GLUCOSEU, HGBUR, BILIRUBINUR, KETONESUR, PROTEINUR, UROBILINOGEN, NITRITE, LEUKOCYTESUR  ----------------------------------------------------------------------------------------------------------------   Imaging Results:    No results found.     Assessment & Plan:    Principal Problem:   Empyema ([place]) Active Problems:   Diabetes mellitus without complication (HCC)   Asthma    Empyema, Pneumonia Vanco iv pharmacy to dose Zosyn iv pharmacy to dose NPO Check cbc, cmp, pt, ptt, LDH CT surgery consulted  Hemoptysis apparently small amount HOLD heparin [place]  Asthma Cont Duoneb q6h and albuterol neb q6h prn   Dm2 Hold metformin  Fsbs q4h, ISS   DVT Prophylaxis  SCDs   AM Labs Ordered, also please review Full Orders  Family Communication: Admission, patients condition and plan of care including tests being ordered have been discussed with the patient  who indicate understanding and agree with the plan and Code Status.  Code Status  FULL CODE  Likely DC to  home  Condition GUARDED    Consults called: CT surgery   Admission status: inpatient, pt will require inpatient stay due to empyema and need for iv abx, and possibly surgical intervention.   Time spent in minutes : 60   Jani Gravel M.D on 11/02/2017 at 12:36 AM  Between 7am to 7pm - Pager - [phone]   After 7pm go to [email or web address] - password [facility]  [facility] - Office  [phone]

## 2017-11-02 NOTE — Progress Notes (Signed)
[address]       Colleen Kim,Colleen Kim [address]             [phone]        Colleen Kim [place] Medical Record #[id number] Date of Birth: 01-Mar-1975  Referring: No ref. provider found Primary Care: Patient, No Pcp Per Primary Cardiologist:No primary care provider on file.  Chief Complaint:   Empyema on the right  History of Present Illness:     Patient is a 42 year old female transferred from [facility] after CT scan was done on Friday showing increasing complex right pleural effusion after being treated for pneumonia. Patient has been in hospital since OCT 4 .  Patient is diabetic and long-term smoker.   Current Activity/ Functional Status: Patient is independent with mobility/ambulation, transfers, ADL's, IADL's.   Zubrod Score: At the time of surgery this patient's most appropriate activity status/level should be described as: [x]     0    Normal activity, no symptoms []     1    Restricted in physical strenuous activity but ambulatory, able to do out light work []     2    Ambulatory and capable of self care, unable to do work activities, up and about                 more than 50%  Of the time                            []     3    Only limited self care, in bed greater than 50% of waking hours []     4    Completely disabled, no self care, confined to bed or chair []     5    Moribund  Past Medical History:  Diagnosis Date  . Asthma   . Cervical cancer ([place])   . Diabetes mellitus     Past Surgical History:  Procedure Laterality Date  . ABDOMINAL HYSTERECTOMY    . ARTERY REPAIR Right    Right Ankle     Social History   Tobacco Use  Smoking Status Current Every Day Smoker  . Packs/day: 1.00  . Types: Cigarettes  Smokeless Tobacco Never Used    Social History   Substance and Sexual Activity  Alcohol Use No     Allergies  Allergen Reactions  . Sulfa Antibiotics Hives    Current Facility-Administered Medications  Medication  Dose Route Frequency Provider Last Rate Last Dose  . 0.9 %  sodium chloride infusion  250 mL Intravenous PRN Colleen Gravel, MD   Stopped at 11/02/17 [phone]  . albuterol (PROVENTIL) (2.5 MG/3ML) 0.083% nebulizer solution 2.5 mg  2.5 mg Nebulization Q6H PRN Colleen Gravel, MD      . benzonatate (TESSALON) capsule 100 mg  100 mg Oral TID PRN Colleen Gravel, MD      . famotidine (PEPCID) tablet 20 mg  20 mg Oral Daily Colleen Gravel, MD      . HYDROcodone-acetaminophen (NORCO/VICODIN) 5-325 MG per tablet 1 tablet  1 tablet Oral Q4H PRN Colleen Gravel, MD   1 tablet at 11/02/17 0211  . [START ON 11/03/2017] Influenza vac split quadrivalent PF (FLUARIX) injection 0.5 mL  0.5 mL Intramuscular Tomorrow-1000 Colleen Latina, MD      . insulin aspart (novoLOG) injection 0-9 Units  0-9 Units Subcutaneous Q4H Colleen Gravel, MD   2 Units at 11/02/17 0420  . ipratropium-albuterol (DUONEB)  0.5-2.5 (3) MG/3ML nebulizer solution 3 mL  3 mL Nebulization Q6H Colleen Gravel, MD      . metoprolol tartrate (LOPRESSOR) tablet 100 mg  100 mg Oral BID Colleen Gravel, MD   100 mg at 11/02/17 0211  . montelukast (SINGULAIR) tablet 10 mg  10 mg Oral QHS Colleen Gravel, MD   10 mg at 11/02/17 0211  . piperacillin-tazobactam (ZOSYN) IVPB 3.375 g  3.375 g Intravenous Q8H Colleen Gravel, MD   Stopped at 11/02/17 [phone]  . [START ON 11/03/2017] pneumococcal 23 valent vaccine (PNU-IMMUNE) injection 0.5 mL  0.5 mL Intramuscular Tomorrow-1000 Colleen Latina, MD      . sodium chloride flush (NS) 0.9 % injection 3 mL  3 mL Intravenous Q12H Colleen Gravel, MD   3 mL at 11/02/17 0212  . sodium chloride flush (NS) 0.9 % injection 3 mL  3 mL Intravenous PRN Colleen Gravel, MD      . vancomycin (VANCOCIN) 1,250 mg in sodium chloride 0.9 % 250 mL IVPB  1,250 mg Intravenous Q12H Colleen Gravel, MD      . vancomycin (VANCOCIN) 1,500 mg in sodium chloride 0.9 % 500 mL IVPB  1,500 mg Intravenous Once Colleen Gravel, MD   Stopped at 11/02/17 0416  . zolpidem (AMBIEN) tablet 5 mg  5 mg Oral QHS PRN Colleen Gravel,  MD   5 mg at 11/02/17 0300    Medications Prior to Admission  Medication Sig Dispense Refill Last Dose  . acetaminophen (TYLENOL) 500 MG tablet Take 1,000 mg by mouth every 6 (six) hours as needed. For pain   Past Week at Unknown  . albuterol (PROVENTIL HFA;VENTOLIN HFA) 108 (90 BASE) MCG/ACT inhaler Inhale 2 puffs into the lungs every 6 (six) hours as needed. For shortness of breath   07/14/2011 at Unknown  . albuterol (PROVENTIL) (2.5 MG/3ML) 0.083% nebulizer solution Take 2.5 mg by nebulization every 6 (six) hours as needed. For shortness of breath   Past Week at Unknown  . HYDROcodone-acetaminophen (NORCO/VICODIN) 5-325 MG per tablet Take 1 tablet by mouth every 4 (four) hours as needed. 12 tablet 0   . ipratropium-albuterol (DUONEB) 0.5-2.5 (3) MG/3ML SOLN Take 3 mLs by nebulization every 4 (four) hours as needed. For shortness of breath   Past Week at Unknown  . metFORMIN (GLUCOPHAGE) 1000 MG tablet Take 1,000 mg by mouth 2 (two) times daily with a meal.   07/13/2011 at Unknown    Family History  Problem Relation Age of Onset  . Hypertension Mother   . Heart failure Father      Review of Systems:   Pertinent items are noted in HPI.     Cardiac Review of Systems: Y or  [    ]= no  Chest Pain [ y   ]  Resting SOB [ y  ] Exertional SOB  Blue.Reese  ]  Orthopnea [  ]   Pedal Edema [   ]    Palpitations [  ] Syncope  [  ]   Presyncope [   ]  General Review of Systems: [Y] = yes [  ]=no Constitional: recent weight change [  ]; anorexia [  ]; fatigue [ y ]; nausea [  ]; night sweats [ y ]; fever Blue.Reese  ]; or chills [  ]  Dental: Last Dentist visit:   Eye : blurred vision [  ]; diplopia [   ]; vision changes [  ];  Amaurosis fugax[  ]; Resp: cough [  ];  wheezing[  ];  hemoptysis[  ]; shortness of breath[  ]; paroxysmal nocturnal dyspnea[  ]; dyspnea on exertion[  ]; or orthopnea[  ];  GI:  gallstones[  ], vomiting[  ];  dysphagia[  ];  melena[  ];  hematochezia [  ]; heartburn[  ];   Hx of  Colonoscopy[  ]; GU: kidney stones [  ]; hematuria[  ];   dysuria [  ];  nocturia[  ];  history of     obstruction [  ]; urinary frequency [  ]             Skin: rash, swelling[  ];, hair loss[  ];  peripheral edema[  ];  or itching[  ]; Musculosketetal: myalgias[  ];  joint swelling[  ];  joint erythema[  ];  joint pain[  ];  back pain[  ];  Heme/Lymph: bruising[  ];  bleeding[  ];  anemia[  ];  Neuro: TIA[  ];  headaches[  ];  stroke[  ];  vertigo[  ];  seizures[  ];   paresthesias[  ];  difficulty walking[  ];  Psych:depression[  ]; anxiety[  ];  Endocrine: diabetes[y  ];  thyroid dysfunction[  ];                 Physical Exam: BP 114/71 (BP Location: Right Arm)   Pulse 77   Temp 98.4 F (36.9 C) (Oral)   Resp (!) 23   Ht 5\' 4"  (1.626 m)   Wt 93.5 kg   SpO2 91%   BMI 35.39 kg/m    General appearance: alert, cooperative and appears older than stated age Head: Normocephalic, without obvious abnormality, atraumatic Neck: no adenopathy, no carotid bruit, no JVD, supple, symmetrical, trachea midline and thyroid not enlarged, symmetric, no tenderness/mass/nodules Lymph nodes: Cervical, supraclavicular, and axillary nodes normal. Resp: diminished breath sounds bilaterally Back: symmetric, no curvature. ROM normal. No CVA tenderness. Cardio: regular rate and rhythm, S1, S2 normal, no murmur, click, rub or gallop GI: soft, non-tender; bowel sounds normal; no masses,  no organomegaly Extremities: extremities normal, atraumatic, no cyanosis or edema and Homans sign is negative, no sign of DVT Neurologic: Grossly normal  Diagnostic Studies & Laboratory data:     Recent Radiology Findings:   [place] Ekg Site Rite  Result Date: 11/02/2017 If Site Rite image not attached, placement could not be confirmed due to current cardiac rhythm.    I have independently reviewed the above radiologic studies and discussed with the patient   Including ct done two days ago at [place] Lab Findings: Lab Results  Component Value Date   WBC 20.7 (H) 11/02/2017   HGB 11.5 (L) 11/02/2017   HCT 36.2 11/02/2017   PLT 376 11/02/2017   GLUCOSE 159 (H) 11/02/2017   ALT 71 (H) 11/02/2017   AST 34 11/02/2017   NA 135 11/02/2017   K 4.1 11/02/2017   CL 90 (L) 11/02/2017   CREATININE 0.76 11/02/2017   BUN 7 11/02/2017   CO2 34 (H) 11/02/2017   INR 1.12 11/02/2017   Recent Results (from the past 240 hour(s))  MRSA PCR Screening     Status: None   Collection Time: 11/02/17  1:29 AM  Result Value Ref Range Status   MRSA by PCR  NEGATIVE NEGATIVE Final    Comment:        The GeneXpert MRSA Assay (FDA approved for NASAL specimens only), is one component of a comprehensive MRSA colonization surveillance program. It is not intended to diagnose MRSA infection nor to guide or monitor treatment for MRSA infections. Performed at [facility], [place] [address] [place], [place] [address]      Assessment / Plan:   1/ progressive community acquired pneumonia now with empyema right 2/ diabetes    Plan to proceed with bronchoscopy right video-assisted thoracoscopy and drainage of empyema. The goals risks and alternatives of the planned surgical procedure bronchoscopy right video-assisted thoracoscopy drainage of empyema have been discussed with the patient in detail. The risks of the procedure including death, infection, stroke, myocardial infarction, bleeding, blood transfusion have all been discussed specifically.  I have quoted Colleen Kim a 2 % of perioperative mortality and a complication rate as high as 40 %. The patient's questions have been answered.Colleen Kim is willing  to proceed with the planned procedure.   Colleen Isaac MD      [place].Colleen Kim ,[place] [address] [place]   Beeper [phone]  11/02/2017 7:06 AM     Patient ID: Colleen Kim, female   DOB:  [birth year], 42 y.o.   MRN: [id number]

## 2017-11-02 NOTE — Anesthesia Procedure Notes (Signed)
Arterial Line Insertion Start/End10/13/2019 8:45 AM, 11/02/2017 8:50 AM Performed by: CRNA  Patient location: Pre-op. Preanesthetic checklist: patient identified, IV checked, site marked, risks and benefits discussed, surgical consent, monitors and equipment checked, pre-op evaluation, timeout performed and anesthesia consent Lidocaine 1% used for infiltration Left, radial was placed Catheter size: 20 G Hand hygiene performed , maximum sterile barriers used  and Seldinger technique used Allen's test indicative of satisfactory collateral circulation Attempts: 1 Procedure performed without using ultrasound guided technique. Following insertion, dressing applied and Biopatch. Post procedure assessment: normal  Patient tolerated the procedure well with no immediate complications.

## 2017-11-02 NOTE — Brief Op Note (Addendum)
      [address]       [place] [address]             [phone]      11/02/2017  11:35 AM  PATIENT:  Colleen Kim  42 y.o. female  PRE-OPERATIVE DIAGNOSIS:  empyema right lung  POST-OPERATIVE DIAGNOSIS:  empyema right lung  PROCEDURE:  Procedure(s): VIDEO ASSISTED THORACOSCOPY Right(VATS)/DRAINAGE EMPYEMA AND DECORticaATION. (Right) FLEXIBLE BRONCHOSCOPY  SURGEON:  Surgeon(s) and Role:    * Grace Isaac, MD - Primary  PHYSICIAN ASSISTANT: WAYNE GOLD PA-C   ANESTHESIA:   general  EBL:  150 mL   BLOOD ADMINISTERED:none  DRAINS: (2) Blake drain(s) in the RIGHT HEMITHORAX   LOCAL MEDICATIONS USED:  NONE  SPECIMEN:  Source of Specimen:  PLEURAL EFFUSION/PEEL FOR MICRO/PATH  DISPOSITION OF SPECIMEN:  MICRO/PATH  COUNTS:  YES   DICTATION: .Other Dictation: Dictation Number PENDING  PLAN OF CARE: Admit to inpatient   PATIENT DISPOSITION:  PACU - hemodynamically stable.   Delay start of Pharmacological VTE agent (>24hrs) due to surgical blood loss or risk of bleeding: yes  COMPLICATIONS: NO KNOWN

## 2017-11-02 NOTE — Progress Notes (Signed)
Patient went to surgery and returned with a DL CVC. PICC order will be cancelled. Reorder PICC if needed.

## 2017-11-02 NOTE — Progress Notes (Signed)
Pt. Refusing to continue administration of antibiotics due to unbearable pain. IV placed in left upper arm per IV team. Upon assessment area was red swollen and hard. Slowing the rate was offered per pt.  request Zoysn and Vancomycin stopped and IV removed pt stated she could no longer tolerate. Possible infiltration. About half of the zosyn was completed and about a third of the vancomycin was completed. Pt is a hard stick 5 attempt via IV team consult orders put in for PICC placement 11/02/2017.

## 2017-11-02 NOTE — Progress Notes (Signed)
[facility]                                                                              Patient Demographics  Colleen Kim, is a 42 y.o. female, DOB - May 19, 1975, [id number]  Admit date - 11/01/2017   Admitting Physician Anda Latina, MD  Outpatient Primary MD for the patient is Patient, No Pcp Per  Outpatient specialists:   LOS - 1  days   Medical records reviewed and are as summarized below:    No chief complaint on file.      Brief summary   Patient is a 42 year old female with diabetes type 2, asthma who was admitted to [facility] for community acquired pneumonia on 10/24/2017.  Patient was treated with IV Zithromax, Rocephin, steroids for asthma.  CT angiogram showed no PE, large bilateral pleural effusions, right> left, areas of lower lobe atelectatic changes bilaterally with consolidation in each lower lobe more on right.  Fracture of lateral right sixth and seventh ribs. P chest x-ray on 10/8 showed pleural effusions appear larger than the recent studies, underlying pulmonary vascular congestion.  Blood cultures, respiratory virus panel negative.  Repeat CT chest on 10/11 showed bilateral pleural effusion larger on the right partially loculated and question left demonstrating partial loculation as well. Patient was transferred to [facility] for CT surgery evaluation for empyema and possibly VATS  Assessment & Plan    Principal Problem:   Empyema ([place]) with community-acquired pneumonia, with small amount of hemoptysis -Continue IV vancomycin and Zosyn -CT surgery consulted -VATS today, management per CT surgery  Active Problems:   Diabetes mellitus without complication ([place]) -Hold metformin, continue sliding scale insulin, - follow hemoglobin A1c    Asthma -Continue duo nebs.   Code Status: Full CODE STATUS DVT Prophylaxis:  SCD's Family Communication: Discussed in detail with the patient, all imaging results, lab results  explained to the patient   Disposition Plan: when cleared by CT surgery   Time Spent in minutes   25  Procedures:  VATS  Consultants:   CTVS  Antimicrobials:   IV vancomycin 10/13  IV Zosyn 10/13   Medications  Scheduled Meds: . [MAR Hold] famotidine  20 mg Oral Daily  . [MAR Hold] Influenza vac split quadrivalent PF  0.5 mL Intramuscular Tomorrow-1000  . [MAR Hold] insulin aspart  0-9 Units Subcutaneous Q4H  . [MAR Hold] ipratropium-albuterol  3 mL Nebulization Q6H  . [MAR Hold] metoprolol tartrate  100 mg Oral BID  . [MAR Hold] montelukast  10 mg Oral QHS  . [MAR Hold] pneumococcal 23 valent vaccine  0.5 mL Intramuscular Tomorrow-1000  . [MAR Hold] sodium chloride flush  3 mL Intravenous Q12H   Continuous Infusions: . [MAR Hold] sodium chloride Stopped (11/02/17 0313)  . [MAR Hold] piperacillin-tazobactam (ZOSYN)  IV 0 mL/hr at 11/02/17 0423  . [MAR Hold] vancomycin    . [MAR Hold] vancomycin Stopped (11/02/17 0416)   PRN Meds:.[MAR Hold] sodium chloride, 0.9 % irrigation (POUR BTL), [MAR Hold] albuterol, [MAR Hold] benzonatate, [MAR Hold] HYDROcodone-acetaminophen, [MAR Hold] sodium chloride flush, [MAR Hold] zolpidem   Antibiotics   Anti-infectives (From admission,  onward)   Start     Dose/Rate Route Frequency Ordered Stop   11/02/17 1800  [MAR Hold]  vancomycin (VANCOCIN) 1,250 mg in sodium chloride 0.9 % 250 mL IVPB     (MAR Hold since Sun 11/02/2017 at 0837. Reason: Transfer to a Procedural area.)   1,250 mg 166.7 mL/hr over 90 Minutes Intravenous Every 12 hours 11/02/17 0210     11/02/17 0300  [MAR Hold]  vancomycin (VANCOCIN) 1,500 mg in sodium chloride 0.9 % 500 mL IVPB     (MAR Hold since Sun 11/02/2017 at 0837. Reason: Transfer to a Procedural area.)   1,500 mg 250 mL/hr over 120 Minutes Intravenous  Once 11/02/17 0210     11/02/17 0200  [MAR Hold]  piperacillin-tazobactam (ZOSYN) IVPB 3.375 g     (MAR Hold since Sun 11/02/2017 at 0837. Reason: Transfer  to a Procedural area.)   3.375 g 12.5 mL/hr over 240 Minutes Intravenous Every 8 hours 11/02/17 0146          Subjective:   Colleen Kim was seen and examined today. Seen in PACU, feeling pain. alert and oriented. Patient denies  abdominal pain, N/V/D/C, new weakness, numbess, tingling.    Objective:   Vitals:   11/02/17 0908 11/02/17 0909 11/02/17 0910 11/02/17 0911  BP:    (!) 121/56  Pulse: 88 86 87 86  Resp: (!) 25 (!) 31 (!) 32 (!) 31  Temp:      TempSrc:      SpO2: 98% 98% 98% 97%  Weight:      Height:        Intake/Output Summary (Last 24 hours) at 11/02/2017 1115 Last data filed at 11/02/2017 1104 Gross per 24 hour  Intake 451.8 ml  Output 250 ml  Net 201.8 ml     Wt Readings from Last 3 Encounters:  11/02/17 93.5 kg     Exam  General: Alert and oriented x 3, NAD, uncomfortable, ill appearing   Eyes:   HEENT:  Atraumatic, normocephalic  Cardiovascular: S1 S2 auscultated, Regular rate and rhythm.  Respiratory: dec BS Rt>Left  Gastrointestinal: Soft, nontender, nondistended, + bowel sounds  Ext: no pedal edema bilaterally  Neuro: no new FND's  Musculoskeletal: No digital cyanosis, clubbing  Skin: No rashes  Psych: uncomfortable     Data Reviewed:  I have personally reviewed following labs and imaging studies  Micro Results Recent Results (from the past 240 hour(s))  MRSA PCR Screening     Status: None   Collection Time: 11/02/17  1:29 AM  Result Value Ref Range Status   MRSA by PCR NEGATIVE NEGATIVE Final    Comment:        The GeneXpert MRSA Assay (FDA approved for NASAL specimens only), is one component of a comprehensive MRSA colonization surveillance program. It is not intended to diagnose MRSA infection nor to guide or monitor treatment for MRSA infections. Performed at [facility], [place] [address] [place], [place] [address]     Radiology Reports Dg Chest 2 View  Result Date: 11/02/2017 CLINICAL DATA:   Follow-up of pneumonia. EXAM: CHEST - 2 VIEW COMPARISON:  CT 10/31/2017.  Plain film 10/28/2017. FINDINGS: Right larger than left pleural effusions, including loculated right-sided effusion posteriorly, similar to on the prior radiograph. No pneumothorax. Midline trachea. Normal heart size. Similar aeration, with right greater than left basilar predominant airspace disease. IMPRESSION: No real change since 11/07/2017 Right greater than left pleural effusions with lower lobe predominant airspace disease. The right-sided  effusion demonstrates loculation posteriorly, as on CT. Electronically Signed   By: Abigail Miyamoto M.D.   On: 11/02/2017 08:41   [place] Ekg Site Rite  Result Date: 11/02/2017 If Site Rite image not attached, placement could not be confirmed due to current cardiac rhythm.   Lab Data:  CBC: Recent Labs  Lab 11/02/17 0246  WBC 20.7*  HGB 11.5*  HCT 36.2  MCV 94.0  PLT 056   Basic Metabolic Panel: Recent Labs  Lab 11/02/17 0246  NA 135  K 4.1  CL 90*  CO2 34*  GLUCOSE 159*  BUN 7  CREATININE 0.76  CALCIUM 8.8*   GFR: Estimated Creatinine Clearance: 103.6 mL/min (by C-G formula based on SCr of 0.76 mg/dL). Liver Function Tests: Recent Labs  Lab 11/02/17 0246  AST 34  ALT 71*  ALKPHOS 150*  BILITOT 0.3  PROT 7.3  ALBUMIN 2.2*   No results for input(s): LIPASE, AMYLASE in the last 168 hours. No results for input(s): AMMONIA in the last 168 hours. Coagulation Profile: Recent Labs  Lab 11/02/17 0246  INR 1.12   Cardiac Enzymes: No results for input(s): CKTOTAL, CKMB, CKMBINDEX, TROPONINI in the last 168 hours. BNP (last 3 results) No results for input(s): PROBNP in the last 8760 hours. HbA1C: No results for input(s): HGBA1C in the last 72 hours. CBG: Recent Labs  Lab 11/02/17 0408  GLUCAP 163*   Lipid Profile: No results for input(s): CHOL, HDL, LDLCALC, TRIG, CHOLHDL, LDLDIRECT in the last 72 hours. Thyroid Function Tests: No results for input(s):  TSH, T4TOTAL, FREET4, T3FREE, THYROIDAB in the last 72 hours. Anemia Panel: No results for input(s): VITAMINB12, FOLATE, FERRITIN, TIBC, IRON, RETICCTPCT in the last 72 hours. Urine analysis: No results found for: COLORURINE, APPEARANCEUR, LABSPEC, PHURINE, GLUCOSEU, HGBUR, BILIRUBINUR, KETONESUR, PROTEINUR, UROBILINOGEN, NITRITE, LEUKOCYTESUR   Ripudeep Rai M.D. [facility] 11/02/2017, 11:15 AM  Pager: [phone] Between 7am to 7pm - call Pager - [phone]  After 7pm go to [email or web address] - password [email or web address]  Call night coverage person covering after 7pm

## 2017-11-02 NOTE — Anesthesia Procedure Notes (Signed)
Central Venous Catheter Insertion Performed by: Albertha Ghee, MD, anesthesiologist Start/End10/13/2019 8:53 AM, 11/02/2017 9:01 AM Patient location: Pre-op. Preanesthetic checklist: patient identified, IV checked, site marked, risks and benefits discussed, surgical consent, monitors and equipment checked, pre-op evaluation, timeout performed and anesthesia consent Position: Trendelenburg Lidocaine 1% used for infiltration and patient sedated Hand hygiene performed , maximum sterile barriers used  and Seldinger technique used Catheter size: 7 Fr Central line was placed.Double lumen Procedure performed using ultrasound guided technique. Ultrasound Notes:anatomy identified, needle tip was noted to be adjacent to the nerve/plexus identified and no ultrasound evidence of intravascular and/or intraneural injection Attempts: 1 Following insertion, line sutured, dressing applied and Biopatch. Post procedure assessment: blood return through all ports, free fluid flow and no air  Patient tolerated the procedure well with no immediate complications.

## 2017-11-02 NOTE — Progress Notes (Signed)
Pharmacy Antibiotic Note  Colleen Kim is a 42 y.o. female transferred from [facility] on 11/01/2017 with empyema.  Pharmacy has been consulted for Vancomycin and Zosyn dosing.  Plan: Vancomycin 1500 mg IV now, then 1250 mg IV q12h Zosyn 3.375 g IV q8h   Height: 5\' 4"  (162.6 cm) Weight: 206 lb (93.4 kg) IBW/kg (Calculated) : 54.7  Temp (24hrs), Avg:99.9 F (37.7 C), Min:99.9 F (37.7 C), Max:99.9 F (37.7 C)  No results for input(s): WBC, CREATININE, LATICACIDVEN, VANCOTROUGH, VANCOPEAK, VANCORANDOM, GENTTROUGH, GENTPEAK, GENTRANDOM, TOBRATROUGH, TOBRAPEAK, TOBRARND, AMIKACINPEAK, AMIKACINTROU, AMIKACIN in the last 168 hours.  CrCl cannot be calculated (No successful lab value found.).    Allergies  Allergen Reactions  . Sulfa Antibiotics Hives    Colleen Kim 11/02/2017 2:09 AM

## 2017-11-02 NOTE — Transfer of Care (Signed)
Immediate Anesthesia Transfer of Care Note  Patient: Colleen Kim  Procedure(s) Performed: VIDEO ASSISTED THORACOSCOPY Right(VATS)/DRAINAGE EMPYEMA AND DECORticaATION. with Electronic Lawernce Keas done for count verification (Right Chest) VIDEO BRONCHOSCOPY (N/A Chest)  Patient Location: PACU  Anesthesia Type:General  Level of Consciousness: awake, alert , oriented and patient cooperative  Airway & Oxygen Therapy: Patient Spontanous Breathing and Patient connected to face mask oxygen  Post-op Assessment: Report given to RN, Post -op Vital signs reviewed and stable and Patient moving all extremities X 4  Post vital signs: Reviewed and stable  Last Vitals:  Vitals Value Taken Time  BP 114/69 11/02/2017 11:56 AM  Temp    Pulse 93 11/02/2017 11:59 AM  Resp 26 11/02/2017 11:59 AM  SpO2 96 % 11/02/2017 11:59 AM  Vitals shown include unvalidated device data.  Last Pain:  Vitals:   11/02/17 0411  TempSrc: Oral  PainSc:          Complications: No apparent anesthesia complications

## 2017-11-02 NOTE — Anesthesia Procedure Notes (Signed)
Procedure Name: Intubation Date/Time: 11/02/2017 9:47 AM Performed by: Renato Shin, CRNA Pre-anesthesia Checklist: Patient identified, Emergency Drugs available, Suction available and Patient being monitored Patient Re-evaluated:Patient Re-evaluated prior to induction Oxygen Delivery Method: Circle system utilized Induction Type: IV induction Laryngoscope Size: Miller and 3 Grade View: Grade I Endobronchial tube: Left, EBT position confirmed by auscultation, Double lumen EBT and EBT position confirmed by fiberoptic bronchoscope and 37 Fr Number of attempts: 1 Airway Equipment and Method: Rigid stylet Placement Confirmation: ETT inserted through vocal cords under direct vision,  positive ETCO2,  CO2 detector and breath sounds checked- equal and bilateral Tube secured with: Tape Dental Injury: Teeth and Oropharynx as per pre-operative assessment

## 2017-11-03 ENCOUNTER — Encounter (HOSPITAL_COMMUNITY): Payer: Self-pay | Admitting: Cardiothoracic Surgery

## 2017-11-03 ENCOUNTER — Inpatient Hospital Stay (HOSPITAL_COMMUNITY): Payer: Self-pay

## 2017-11-03 DIAGNOSIS — L8990 Pressure ulcer of unspecified site, unspecified stage: Secondary | ICD-10-CM

## 2017-11-03 HISTORY — PX: IR THORACENTESIS ASP PLEURAL SPACE W/IMG GUIDE: IMG5380

## 2017-11-03 LAB — BASIC METABOLIC PANEL WITH GFR
Anion gap: 7 (ref 5–15)
BUN: 14 mg/dL (ref 6–20)
CO2: 30 mmol/L (ref 22–32)
Calcium: 8.4 mg/dL — ABNORMAL LOW (ref 8.9–10.3)
Chloride: 96 mmol/L — ABNORMAL LOW (ref 98–111)
Creatinine, Ser: 0.71 mg/dL (ref 0.44–1.00)
GFR calc Af Amer: >60 mL/min (ref >60)
GFR calc non Af Amer: >60 mL/min (ref >60)
Glucose, Bld: 241 mg/dL — ABNORMAL HIGH (ref 70–99)
Potassium: 4.3 mmol/L (ref 3.5–5.1)
Sodium: 133 mmol/L — ABNORMAL LOW (ref 135–145)

## 2017-11-03 LAB — CBC
HCT: 32.2 % — ABNORMAL LOW (ref 36.0–46.0)
Hemoglobin: 9.9 g/dL — ABNORMAL LOW (ref 12.0–15.0)
MCH: 28.9 pg (ref 26.0–34.0)
MCHC: 30.7 g/dL (ref 30.0–36.0)
MCV: 93.9 fL (ref 80.0–100.0)
Platelets: 390 K/uL (ref 150–400)
RBC: 3.43 MIL/uL — ABNORMAL LOW (ref 3.87–5.11)
RDW: 12.2 % (ref 11.5–15.5)
WBC: 22.2 K/uL — ABNORMAL HIGH (ref 4.0–10.5)
nRBC: 0 % (ref 0.0–0.2)

## 2017-11-03 LAB — BLOOD GAS, ARTERIAL
Acid-Base Excess: 7.3 mmol/L — ABNORMAL HIGH (ref 0.0–2.0)
BICARBONATE: 32.1 mmol/L — AB (ref 20.0–28.0)
O2 Content: 2 L/min
O2 SAT: 92.2 %
PATIENT TEMPERATURE: 98.6
pCO2 arterial: 52.8 mmHg — ABNORMAL HIGH (ref 32.0–48.0)
pH, Arterial: 7.4 (ref 7.350–7.450)
pO2, Arterial: 66.2 mmHg — ABNORMAL LOW (ref 83.0–108.0)

## 2017-11-03 LAB — GLUCOSE, CAPILLARY
GLUCOSE-CAPILLARY: 214 mg/dL — AB (ref 70–99)
Glucose-Capillary: 176 mg/dL — ABNORMAL HIGH (ref 70–99)
Glucose-Capillary: 216 mg/dL — ABNORMAL HIGH (ref 70–99)
Glucose-Capillary: 218 mg/dL — ABNORMAL HIGH (ref 70–99)
Glucose-Capillary: 141 mg/dL — ABNORMAL HIGH (ref 70–99)

## 2017-11-03 LAB — HEMOGLOBIN A1C
Hgb A1c MFr Bld: 7.3 % — ABNORMAL HIGH (ref 4.8–5.6)
MEAN PLASMA GLUCOSE: 162.81 mg/dL

## 2017-11-03 LAB — ACID FAST SMEAR (AFB, MYCOBACTERIA)
Acid Fast Smear: NEGATIVE
Acid Fast Smear: NEGATIVE

## 2017-11-03 MED ORDER — METOPROLOL TARTRATE 50 MG PO TABS
50.0000 mg | ORAL_TABLET | Freq: Two times a day (BID) | ORAL | Status: DC
  Administered 2017-11-03 – 2017-11-06 (×6): 50 mg via ORAL
  Filled 2017-11-03 (×6): qty 1

## 2017-11-03 MED ORDER — LIDOCAINE HCL 1 % IJ SOLN
INTRAMUSCULAR | Status: AC
  Filled 2017-11-03: qty 20

## 2017-11-03 MED ORDER — IPRATROPIUM-ALBUTEROL 0.5-2.5 (3) MG/3ML IN SOLN
3.0000 mL | Freq: Two times a day (BID) | RESPIRATORY_TRACT | Status: DC
  Administered 2017-11-03 – 2017-11-04 (×2): 3 mL via RESPIRATORY_TRACT
  Filled 2017-11-03 (×2): qty 3

## 2017-11-03 MED ORDER — INSULIN ASPART 100 UNIT/ML ~~LOC~~ SOLN
0.0000 [IU] | Freq: Three times a day (TID) | SUBCUTANEOUS | Status: DC
  Administered 2017-11-03: 5 [IU] via SUBCUTANEOUS
  Administered 2017-11-03 – 2017-11-04 (×3): 2 [IU] via SUBCUTANEOUS
  Administered 2017-11-04 – 2017-11-05 (×4): 3 [IU] via SUBCUTANEOUS
  Administered 2017-11-06 (×2): 2 [IU] via SUBCUTANEOUS

## 2017-11-03 MED ORDER — SODIUM CHLORIDE 0.9 % IV SOLN: INTRAVENOUS | Status: DC | PRN

## 2017-11-03 MED ORDER — INSULIN ASPART 100 UNIT/ML ~~LOC~~ SOLN
0.0000 [IU] | Freq: Every day | SUBCUTANEOUS | Status: DC
  Administered 2017-11-03 – 2017-11-05 (×2): 2 [IU] via SUBCUTANEOUS

## 2017-11-03 MED ORDER — NAPHAZOLINE-GLYCERIN 0.012-0.2 % OP SOLN
1.0000 [drp] | Freq: Four times a day (QID) | OPHTHALMIC | Status: DC | PRN
  Filled 2017-11-03: qty 15

## 2017-11-03 MED ORDER — LIDOCAINE HCL (PF) 2 % IJ SOLN
INTRAMUSCULAR | Status: AC
  Filled 2017-11-03: qty 10

## 2017-11-03 MED ORDER — LIDOCAINE HCL (PF) 1 % IJ SOLN
INTRAMUSCULAR | Status: DC | PRN
  Administered 2017-11-03: 10 mL

## 2017-11-03 NOTE — Op Note (Signed)
Colleen Kim, Colleen Kim MEDICAL RECORD [id number] ACCOUNT [id number] DATE OF BIRTH:May 22, 1975 FACILITY: [facility] LOCATION: [facility] PHYSICIAN:Denese Mentink Maryruth Bun, MD  OPERATIVE REPORT  DATE OF PROCEDURE:  11/02/2017  PREOPERATIVE DIAGNOSIS:  Right lung pneumonia with empyema.  POSTOPERATIVE DIAGNOSIS:  Right lung pneumonia with empyema.  SURGICAL PROCEDURES: Video Bronchoscopy, right video-assisted thoracoscopy, mini thoracotomy, drainage of right empyema and decortication.  SURGEON:  Lanelle Bal, MD  FIRST ASSISTANT:  Jadene Pierini, [place].  BRIEF HISTORY:  The patient is a 42 year old diabetic female from [place] who had increasing shortness of breath and chest discomfort and was admitted to [facility] on October 4th.  She has been hospitalized up until 11/02/2017 when she was  transferred to [facility] because on 10/31/2017, a followup CT scan of her pneumonia was done which showed increasing complex effusion.  She was transferred for further treatment.  The patient was seen by myself and CT scans were reviewed and were  recommended to her that we proceed with decortication and drainage of her empyema on the right.  Risks and options were discussed with her and she agreed and signed informed consent.  DESCRIPTION OF PROCEDURE:  The patient underwent general endotracheal anesthesia with a single lumen endotracheal tube.  Appropriate timeout was performed.  We then proceeded with video bronchoscopy to the subsegmental level of both the right and left  tracheobronchial tree.  There were no endobronchial lesions noted.  Very little in the way of pulmonary secretions were noted.  Bronchial washings of the right lower lobe were obtained and sent for culture.  The scope was removed.  A double lumen  endotracheal tube was placed.  The patient was turned in lateral decubitus position with the right side up.  The right side had preoperatively been marked and confirmed with findings on the  chest x-ray and CT scans done preoperatively.  A second timeout  was performed and we proceeded with a small incision, approximately the sixth intercostal space mid axillary line.  Through this, a 5 mm port was introduced.  The scope entered into the complex pleural effusion with significant coagulated fluid.  The  incision was enlarged and using a curved sponge stick, loculations were broken up.  Fluid was removed.  This allowed [place] a second port to be placed slightly lower, and we then proceeded with full drainage and removal of proteinaceous debris and clearage  of the empyema.  This was a somewhat tedious and inherent material was removed from the lung, especially the lower lobe.  With this material removed.  Cultures obtained, cytologies pathology obtained.  Two Blake drains were left in place and the lung  reinflated.  The incision was closed with interrupted 0 Vicryl, running 3-0 Vicryl subcutaneous tissue with 3-0 subcuticular stitch in skin edges.  Dermabond was applied.  The right lung reinflated nicely.    Estimated blood loss was 200 mL.    Sponge and needle count was correct at the completion of the procedure.    The right chest was wanded confirming lack of retained sponges.    The patient was then awakened in the operating room and transferred to the recovery room for further postoperative care.  AN/NUANCE  D:11/03/2017 T:11/03/2017 JOB:[id number]

## 2017-11-03 NOTE — Progress Notes (Addendum)
      [address]       [place] [address]             [phone]      1 Day Post-Op Procedure(s) (LRB): VIDEO ASSISTED THORACOSCOPY Right(VATS)/DRAINAGE EMPYEMA AND DECORticaATION. with Electronic Lawernce Keas done for count verification (Right) VIDEO BRONCHOSCOPY (N/A) Subjective: Feels much better. Disappointed with the nighttime nursing care but daytime has been great.   Objective: Vital signs in last 24 hours: Temp:  [97.7 F (36.5 C)-98.7 F (37.1 C)] 97.9 F (36.6 C) (10/14 0733) Pulse Rate:  [60-95] 60 (10/14 0733) Cardiac Rhythm: Normal sinus rhythm (10/14 0700) Resp:  [13-27] 17 (10/14 0753) BP: (111-148)/(62-82) 112/65 (10/14 0733) SpO2:  [90 %-100 %] 100 % (10/14 0753) Arterial Line BP: (100-273)/(45-268) 273/268 (10/14 0400)     Intake/Output from previous day: 10/13 0701 - 10/14 0700 In: 4004 [P.O.:240; I.V.:3125; IV Piggyback:639] Out: 952 [Urine:700; Blood:150; Chest Tube:102] Intake/Output this shift: No intake/output data recorded.  General appearance: alert, cooperative and no distress Heart: regular rate and rhythm, S1, S2 normal, no murmur, click, rub or gallop Lungs: clear to auscultation bilaterally and diminished in bilateral lower lobes Abdomen: soft, non-tender; bowel sounds normal; no masses,  no organomegaly Extremities: extremities normal, atraumatic, no cyanosis or edema Wound: clean and dry  Lab Results: Recent Labs    11/02/17 0246 11/03/17 0400  WBC 20.7* 22.2*  HGB 11.5* 9.9*  HCT 36.2 32.2*  PLT 376 390   BMET:  Recent Labs    11/02/17 0246 11/03/17 0400  NA 135 133*  K 4.1 4.3  CL 90* 96*  CO2 34* 30  GLUCOSE 159* 241*  BUN 7 14  CREATININE 0.76 0.71  CALCIUM 8.8* 8.4*    PT/INR:  Recent Labs    11/02/17 0246  LABPROT 14.3  INR 1.12   ABG    Component Value Date/Time   PHART 7.400 11/03/2017 0355   HCO3 32.1 (H) 11/03/2017 0355   O2SAT 92.2 11/03/2017 0355   CBG (last 3)  Recent Labs    11/02/17 2341 11/03/17 0402 11/03/17 0803  GLUCAP 342* 176* 216*    Assessment/Plan: S/P Procedure(s) (LRB): VIDEO ASSISTED THORACOSCOPY Right(VATS)/DRAINAGE EMPYEMA AND DECORticaATION. with Electronic Lawernce Keas done for count verification (Right) VIDEO BRONCHOSCOPY (N/A)  1. CV-NSR in the 60s, BP well controlled. Continue Lopressor. 2. Pulm-wean oxygen as tolerated. Encouraged incentive spirometer. CXR showed no pneumothorax, persistent bibasilar interstitial densities. Small bilateral pleural effusions.  3. Renal-creatinine 0.71, electrolytes okay 4. Antibiotics-continue Vanc and Zosyn per Medicine 5. H and H stable at 9.9/32.2, expected acute blood loss anemia  Plan: Discontinue arterial line and foley catheter. Continue chest tubes for now, no air leak. Working on pain control-continue PCA.    LOS: 2 days    [name] 11/03/2017 I have seen and examined Colleen Kim and agree with the above assessment  and plan.  Grace Isaac MD Beeper [phone] Office [phone] 11/03/2017 6:43 PM

## 2017-11-03 NOTE — Procedures (Signed)
PROCEDURE SUMMARY:  Successful US guided left thoracentesis. Yielded 3 mL of clear yellow fluid. Patient tolerated procedure well. No immediate complications.  Specimen was sent for labs.  Post procedure chest X-ray reveals no pneumothorax  Jalila Goodnough S Dary Dilauro PA-C 11/03/2017 2:05 PM

## 2017-11-03 NOTE — Social Work (Signed)
CSW acknowledging consult entered by RN as "commitments." No indication of need for commitment- if another need arises please let CSW know.  CSW signing off. Please consult if any additional needs arise.  Alexander Mt, [place] Work [phone]

## 2017-11-03 NOTE — Progress Notes (Signed)
[facility]                                                                              Patient Demographics  Colleen Kim, is a 42 y.o. female, DOB - [birth year], VCB:[id number]  Admit date - 11/01/2017   Admitting Physician Anda Latina, MD  Outpatient Primary MD for the patient is Patient, No Pcp Per  Outpatient specialists:   LOS - 2  days   Medical records reviewed and are as summarized below:    No chief complaint on file.      Brief summary   Patient is a 42 year old female with diabetes type 2, asthma who was admitted to [facility] for community acquired pneumonia on 10/24/2017.  Patient was treated with IV Zithromax, Rocephin, steroids for asthma.  CT angiogram showed no PE, large bilateral pleural effusions, right> left, areas of lower lobe atelectatic changes bilaterally with consolidation in each lower lobe more on right.  Fracture of lateral right sixth and seventh ribs. P chest x-ray on 10/8 showed pleural effusions appear larger than the recent studies, underlying pulmonary vascular congestion.  Blood cultures, respiratory virus panel negative.  Repeat CT chest on 10/11 showed bilateral pleural effusion larger on the right partially loculated and question left demonstrating partial loculation as well. Patient was transferred to [facility] for CT surgery evaluation for empyema and possibly VATS  Assessment & Plan    Principal Problem:   Empyema ([place]) with community-acquired pneumonia, with small amount of hemoptysis -Continue IV vancomycin and Zosyn -CT surgery was consulted, patient underwent right diet, drainage of right empyema and decortication on 10/13, postop day #1 -Currently stable, management per CT surgery -Wean O2 as tolerated, chest tube + -Leukocytosis still trending up, follow cultures from empyema  -Blood cultures at [facility] negative  Active Problems:   Diabetes mellitus uncontrolled with hyperglycemia -CBG in  200s, change diet to carb modified -Placed on sliding scale insulin, moderate, night coverage -Follow hemoglobin A1c    Asthma -Continue duo nebs, currently no wheezing     Code Status: Full CODE STATUS DVT Prophylaxis:  SCD's Family Communication: Discussed in detail with the patient, all imaging results, lab results explained to the patient   Disposition Plan: when cleared by CT surgery   Time Spent in minutes   25  Procedures:  VATS  Consultants:   CTVS  Antimicrobials:   IV vancomycin 10/13  IV Zosyn 10/13   Medications  Scheduled Meds: . acetaminophen  1,000 mg Oral Q6H   Or  . acetaminophen (TYLENOL) oral liquid 160 mg/5 mL  1,000 mg Oral Q6H  . bisacodyl  10 mg Oral Daily  . famotidine  20 mg Oral Daily  . fentaNYL   Intravenous Q4H  . Influenza vac split quadrivalent PF  0.5 mL Intramuscular Tomorrow-1000  . insulin aspart  0-24 Units Subcutaneous Q4H  . ipratropium-albuterol  3 mL Nebulization BID  . metoprolol tartrate  100 mg Oral BID  . montelukast  10 mg Oral QHS  . pneumococcal 23 valent vaccine  0.5 mL Intramuscular Tomorrow-1000  . senna-docusate  1 tablet Oral QHS  . sodium chloride  flush  10-40 mL Intracatheter Q12H   Continuous Infusions: . sodium chloride    . dextrose 5 % and 0.9% NaCl 10 mL/hr at 11/03/17 0854  . piperacillin-tazobactam (ZOSYN)  IV 3.375 g (11/03/17 1043)  . potassium chloride    . vancomycin 1,250 mg (11/03/17 0601)   PRN Meds:.sodium chloride, albuterol, benzonatate, diphenhydrAMINE **OR** diphenhydrAMINE, naloxone **AND** sodium chloride flush, ondansetron (ZOFRAN) IV, oxyCODONE, potassium chloride, sodium chloride flush, traMADol   Antibiotics   Anti-infectives (From admission, onward)   Start     Dose/Rate Route Frequency Ordered Stop   11/02/17 1800  vancomycin (VANCOCIN) 1,250 mg in sodium chloride 0.9 % 250 mL IVPB     1,250 mg 166.7 mL/hr over 90 Minutes Intravenous Every 12 hours 11/02/17 0210      11/02/17 0300  vancomycin (VANCOCIN) 1,500 mg in sodium chloride 0.9 % 500 mL IVPB  Status:  Discontinued     1,500 mg 250 mL/hr over 120 Minutes Intravenous  Once 11/02/17 0210 11/02/17 1348   11/02/17 0200  piperacillin-tazobactam (ZOSYN) IVPB 3.375 g     3.375 g 12.5 mL/hr over 240 Minutes Intravenous Every 8 hours 11/02/17 0146          Subjective:   Marjie Chea was seen and examined today.  Feeling a lot better today, denies any specific complaints.  No fevers or chills.  Pain controlled with PCA, 8/10 at the time of my encounter.  No nausea vomiting or abdominal pain.    Objective:   Vitals:   11/03/17 0733 11/03/17 0734 11/03/17 0753 11/03/17 1027  BP: 112/65   103/63  Pulse: 60   70  Resp: (!) 21  17 19   Temp: 97.9 F (36.6 C)   98.7 F (37.1 C)  TempSrc: Oral   Oral  SpO2: 100% 100% 100% 97%  Weight:      Height:        Intake/Output Summary (Last 24 hours) at 11/03/2017 1108 Last data filed at 11/03/2017 0400 Gross per 24 hour  Intake 3753.98 ml  Output 702 ml  Net 3051.98 ml     Wt Readings from Last 3 Encounters:  11/02/17 93.5 kg     Exam    General: Alert and oriented x 3, NAD  Eyes:   HEENT:  Atraumatic, normocephalic  Cardiovascular: S1 S2 auscultated,  Regular rate and rhythm. No pedal edema b/l  Respiratory: Decreased breath sound at the bases  Gastrointestinal: Soft, nontender, nondistended, + bowel sounds  Ext: no pedal edema bilaterally  Neuro: no new deficits, sitting up in the chair, moving all 4 extremities  Musculoskeletal: No digital cyanosis, clubbing  Skin: No rashes  Psych: Normal affect and demeanor, alert and oriented x3    Data Reviewed:  I have personally reviewed following labs and imaging studies  Micro Results Recent Results (from the past 240 hour(s))  MRSA PCR Screening     Status: None   Collection Time: 11/02/17  1:29 AM  Result Value Ref Range Status   MRSA by PCR NEGATIVE NEGATIVE Final     Comment:        The GeneXpert MRSA Assay (FDA approved for NASAL specimens only), is one component of a comprehensive MRSA colonization surveillance program. It is not intended to diagnose MRSA infection nor to guide or monitor treatment for MRSA infections. Performed at [facility], [place] [address], [place], [place] [address]   Culture, respiratory     Status: None (Preliminary result)   Collection Time:  11/02/17  9:47 AM  Result Value Ref Range Status   Specimen Description BRONCHIAL WASHINGS  Final   Special Requests NONE  Final   Gram Stain   Final    RARE WBC PRESENT, PREDOMINANTLY PMN RARE GRAM POSITIVE COCCI    Culture   Final    FEW STAPHYLOCOCCUS AUREUS SUSCEPTIBILITIES TO FOLLOW Performed at [facility], [address], [place], [place] [address]    Report Status PENDING  Incomplete  Aerobic/Anaerobic Culture (surgical/deep wound)     Status: None (Preliminary result)   Collection Time: 11/02/17 10:29 AM  Result Value Ref Range Status   Specimen Description PLEURAL RIGTH  Final   Special Requests ZOCYN VANCOMYCIN  Final   Gram Stain   Final    RARE WBC PRESENT, PREDOMINANTLY PMN NO ORGANISMS SEEN Performed at [facility], [place] [address], [place], [place] [address]    Culture PENDING  Incomplete   Report Status PENDING  Incomplete  Culture, fungus without smear     Status: None (Preliminary result)   Collection Time: 11/02/17 11:00 AM  Result Value Ref Range Status   Specimen Description PLEURAL  Final   Special Requests RIGHT  Final   Culture   Final    NO GROWTH < 12 HOURS Performed at [facility], [place] [address] [place], [place] [address]    Report Status PENDING  Incomplete  Aerobic/Anaerobic Culture (surgical/deep wound)     Status: None (Preliminary result)   Collection Time: 11/02/17 11:30 AM  Result Value Ref Range Status   Specimen Description PLEURAL  Final   Special Requests RIGHT  Final   Gram Stain   Final    FEW  WBC PRESENT,BOTH PMN AND MONONUCLEAR NO ORGANISMS SEEN Performed at [facility], [address] [place],  [address]    Culture PENDING  Incomplete   Report Status PENDING  Incomplete    Radiology Reports Dg Chest 2 View  Result Date: 11/02/2017 CLINICAL DATA:  Follow-up of pneumonia. EXAM: CHEST - 2 VIEW COMPARISON:  CT 10/31/2017.  Plain film 10/28/2017. FINDINGS: Right larger than left pleural effusions, including loculated right-sided effusion posteriorly, similar to on the prior radiograph. No pneumothorax. Midline trachea. Normal heart size. Similar aeration, with right greater than left basilar predominant airspace disease. IMPRESSION: No real change since 11/07/2017 Right greater than left pleural effusions with lower lobe predominant airspace disease. The right-sided effusion demonstrates loculation posteriorly, as on CT. Electronically Signed   By: Abigail Miyamoto M.D.   On: 11/02/2017 08:41   Dg Chest Port 1 View  Result Date: 11/03/2017 CLINICAL DATA:  Follow-up empyema with chest tube drainage EXAM: PORTABLE CHEST 1 VIEW COMPARISON:  Portable chest x-ray of November 02, 2017 FINDINGS: The lungs remain hypoinflated. Bibasilar interstitial density persists. The left hemidiaphragm is less well demonstrated. The right-sided chest tubes are in stable position. The right internal jugular venous catheter tip projects over the midportion of the SVC. The cardiac silhouette is enlarged. The pulmonary vascularity is normal. There are small bilateral pleural effusions layering posteriorly. IMPRESSION: Stable appearance of the chest. No pneumothorax. Persistent bibasilar interstitial densities slightly more conspicuous on the left today. Stable small bilateral pleural effusions layering posteriorly. Electronically Signed   By: David  Martinique M.D.   On: 11/03/2017 07:39   Dg Chest Port 1 View  Result Date: 11/02/2017 CLINICAL DATA:  Status post right VATS EXAM: PORTABLE CHEST 1 VIEW  COMPARISON:  11/02/2017 FINDINGS: Cardiac shadow is  stable. Right jugular central line is noted. Right-sided thoracostomy catheters are now seen as well as significant evacuation of previously noted right pleural effusion. Some atelectatic changes are noted in the base on the right. Stable atelectatic changes in the left base are noted. No pneumothorax is seen. IMPRESSION: Status post VATS on the right with chest tube placement. No pneumothorax is noted. Improved aeration in the right base with some residual atelectasis although the pleural effusion has resolved. Persistent left basilar atelectasis is noted. Electronically Signed   By: Inez Catalina M.D.   On: 11/02/2017 14:38   [place] Ekg Site Rite  Result Date: 11/02/2017 If Site Rite image not attached, placement could not be confirmed due to current cardiac rhythm.   Lab Data:  CBC: Recent Labs  Lab 11/02/17 0246 11/03/17 0400  WBC 20.7* 22.2*  HGB 11.5* 9.9*  HCT 36.2 32.2*  MCV 94.0 93.9  PLT 376 417   Basic Metabolic Panel: Recent Labs  Lab 11/02/17 0246 11/03/17 0400  NA 135 133*  K 4.1 4.3  CL 90* 96*  CO2 34* 30  GLUCOSE 159* 241*  BUN 7 14  CREATININE 0.76 0.71  CALCIUM 8.8* 8.4*   GFR: Estimated Creatinine Clearance: 103.6 mL/min (by C-G formula based on SCr of 0.71 mg/dL). Liver Function Tests: Recent Labs  Lab 11/02/17 0246  AST 34  ALT 71*  ALKPHOS 150*  BILITOT 0.3  PROT 7.3  ALBUMIN 2.2*   No results for input(s): LIPASE, AMYLASE in the last 168 hours. No results for input(s): AMMONIA in the last 168 hours. Coagulation Profile: Recent Labs  Lab 11/02/17 0246  INR 1.12   Cardiac Enzymes: No results for input(s): CKTOTAL, CKMB, CKMBINDEX, TROPONINI in the last 168 hours. BNP (last 3 results) No results for input(s): PROBNP in the last 8760 hours. HbA1C: Recent Labs    11/02/17 0246  HGBA1C 7.4*   CBG: Recent Labs  Lab 11/02/17 1708 11/02/17 2013 11/02/17 2341 11/03/17 0402  11/03/17 0803  GLUCAP 317* 287* 342* 176* 216*   Lipid Profile: No results for input(s): CHOL, HDL, LDLCALC, TRIG, CHOLHDL, LDLDIRECT in the last 72 hours. Thyroid Function Tests: No results for input(s): TSH, T4TOTAL, FREET4, T3FREE, THYROIDAB in the last 72 hours. Anemia Panel: No results for input(s): VITAMINB12, FOLATE, FERRITIN, TIBC, IRON, RETICCTPCT in the last 72 hours. Urine analysis: No results found for: COLORURINE, APPEARANCEUR, LABSPEC, PHURINE, GLUCOSEU, HGBUR, BILIRUBINUR, KETONESUR, PROTEINUR, UROBILINOGEN, NITRITE, LEUKOCYTESUR   Unnamed Zeien M.D. [facility] 11/03/2017, 11:08 AM  Pager: [phone] Between 7am to 7pm - call Pager - [phone]  After 7pm go to [email or web address] - password [email or web address]  Call night coverage person covering after 7pm

## 2017-11-04 DIAGNOSIS — J90 Pleural effusion, not elsewhere classified: Secondary | ICD-10-CM

## 2017-11-04 LAB — GLUCOSE, CAPILLARY
GLUCOSE-CAPILLARY: 148 mg/dL — AB (ref 70–99)
Glucose-Capillary: 125 mg/dL — ABNORMAL HIGH (ref 70–99)
Glucose-Capillary: 135 mg/dL — ABNORMAL HIGH (ref 70–99)
Glucose-Capillary: 151 mg/dL — ABNORMAL HIGH (ref 70–99)
Glucose-Capillary: 222 mg/dL — ABNORMAL HIGH (ref 70–99)

## 2017-11-04 LAB — COMPREHENSIVE METABOLIC PANEL
ALK PHOS: 101 U/L (ref 38–126)
ALT: 50 U/L — AB (ref 0–44)
AST: 22 U/L (ref 15–41)
Albumin: 2 g/dL — ABNORMAL LOW (ref 3.5–5.0)
Anion gap: 6 (ref 5–15)
BILIRUBIN TOTAL: 0.4 mg/dL (ref 0.3–1.2)
BUN: 15 mg/dL (ref 6–20)
CALCIUM: 8.5 mg/dL — AB (ref 8.9–10.3)
CO2: 30 mmol/L (ref 22–32)
CREATININE: 0.85 mg/dL (ref 0.44–1.00)
Chloride: 100 mmol/L (ref 98–111)
Glucose, Bld: 161 mg/dL — ABNORMAL HIGH (ref 70–99)
Potassium: 4.2 mmol/L (ref 3.5–5.1)
SODIUM: 136 mmol/L (ref 135–145)
TOTAL PROTEIN: 6.2 g/dL — AB (ref 6.5–8.1)

## 2017-11-04 LAB — CULTURE, RESPIRATORY W GRAM STAIN

## 2017-11-04 LAB — CBC
HCT: 31.9 % — ABNORMAL LOW (ref 36.0–46.0)
Hemoglobin: 10 g/dL — ABNORMAL LOW (ref 12.0–15.0)
MCH: 29.8 pg (ref 26.0–34.0)
MCHC: 31.3 g/dL (ref 30.0–36.0)
MCV: 94.9 fL (ref 80.0–100.0)
Platelets: 388 10*3/uL (ref 150–400)
RBC: 3.36 MIL/uL — ABNORMAL LOW (ref 3.87–5.11)
RDW: 12.6 % (ref 11.5–15.5)
WBC: 14 10*3/uL — ABNORMAL HIGH (ref 4.0–10.5)
nRBC: 0.1 % (ref 0.0–0.2)

## 2017-11-04 LAB — VANCOMYCIN, TROUGH: Vancomycin Tr: 15 ug/mL (ref 15–20)

## 2017-11-04 MED ORDER — BISACODYL 10 MG RE SUPP: 10.0000 mg | Freq: Every day | RECTAL | Status: DC | PRN

## 2017-11-04 MED ORDER — SENNOSIDES-DOCUSATE SODIUM 8.6-50 MG PO TABS
1.0000 | ORAL_TABLET | Freq: Two times a day (BID) | ORAL | Status: DC
  Administered 2017-11-04 (×2): 1 via ORAL
  Filled 2017-11-04 (×4): qty 1

## 2017-11-04 MED ORDER — POLYETHYLENE GLYCOL 3350 17 G PO PACK: 17.0000 g | PACK | Freq: Every day | ORAL | Status: DC | PRN

## 2017-11-04 MED ORDER — IPRATROPIUM-ALBUTEROL 0.5-2.5 (3) MG/3ML IN SOLN
3.0000 mL | Freq: Four times a day (QID) | RESPIRATORY_TRACT | Status: DC | PRN
  Administered 2017-11-06: 3 mL via RESPIRATORY_TRACT
  Filled 2017-11-04: qty 3

## 2017-11-04 NOTE — Progress Notes (Signed)
[facility]                                                                              Patient Demographics  Colleen Kim, is a 42 y.o. female, DOB - 1975/10/16, KVQ:[id number]  Admit date - 11/01/2017   Admitting Physician Anda Latina, MD  Outpatient Primary MD for the patient is Patient, No Pcp Per  Outpatient specialists:   LOS - 3  days   Medical records reviewed and are as summarized below:    No chief complaint on file.      Brief summary   Patient is a 42 year old female with diabetes type 2, asthma who was admitted to [facility] for community acquired pneumonia on 10/24/2017.  Patient was treated with IV Zithromax, Rocephin, steroids for asthma.  CT angiogram showed no PE, large bilateral pleural effusions, right> left, areas of lower lobe atelectatic changes bilaterally with consolidation in each lower lobe more on right.  Fracture of lateral right sixth and seventh ribs. P chest x-ray on 10/8 showed pleural effusions appear larger than the recent studies, underlying pulmonary vascular congestion.  Blood cultures, respiratory virus panel negative.  Repeat CT chest on 10/11 showed bilateral pleural effusion larger on the right partially loculated and question left demonstrating partial loculation as well. Patient was transferred to [facility] for CT surgery evaluation for empyema and possibly VATS  Assessment & Plan    Principal Problem:   Empyema ([place]) with community-acquired pneumonia, with small amount of hemoptysis -Continue IV vancomycin and Zosyn -CT surgery was consulted, patient underwent right diet, drainage of right empyema and decortication on 10/13, postop day # 2  -Pain better controlled with PCA, wean O2 as tolerated -Plan for removing anterior chest tube today, PT evaluation -Cultures so far negative, however patient had received IV antibiotics prior to admission at [facility].  Blood cultures at [facility]  negative  Active Problems: Left-sided pleural effusion, small -3 mL of clear yellow fluid removed on 10/14, cultures negative so far, follow studies    Diabetes mellitus uncontrolled with hyperglycemia -CBGs improving, hemoglobin A1c 7.3    Asthma -Continue duo nebs, currently no wheezing  Constipation -Patient reports no BM since admission -Added MiraLAX daily as needed with Senokot, Dulcolax suppository as needed  Code Status: Full CODE STATUS DVT Prophylaxis:  SCD's Family Communication: Discussed in detail with the patient, all imaging results, lab results explained to the patient   Disposition Plan: when cleared by CT surgery   Time Spent in minutes   25  Procedures:  VATS  Consultants:   CTVS  Antimicrobials:   IV vancomycin 10/13  IV Zosyn 10/13   Medications  Scheduled Meds: . acetaminophen  1,000 mg Oral Q6H   Or  . acetaminophen (TYLENOL) oral liquid 160 mg/5 mL  1,000 mg Oral Q6H  . bisacodyl  10 mg Oral Daily  . famotidine  20 mg Oral Daily  . fentaNYL   Intravenous Q4H  . Influenza vac split quadrivalent PF  0.5 mL Intramuscular Tomorrow-1000  . insulin aspart  0-15 Units Subcutaneous TID WC  . insulin aspart  0-5 Units Subcutaneous QHS  .  ipratropium-albuterol  3 mL Nebulization BID  . metoprolol tartrate  50 mg Oral BID  . montelukast  10 mg Oral QHS  . pneumococcal 23 valent vaccine  0.5 mL Intramuscular Tomorrow-1000  . senna-docusate  1 tablet Oral BID  . sodium chloride flush  10-40 mL Intracatheter Q12H   Continuous Infusions: . sodium chloride    . piperacillin-tazobactam (ZOSYN)  IV 3.375 g (11/04/17 0933)  . potassium chloride    . vancomycin 1,250 mg (11/04/17 0615)   PRN Meds:.sodium chloride, albuterol, benzonatate, bisacodyl, diphenhydrAMINE **OR** diphenhydrAMINE, lidocaine (PF), naloxone **AND** sodium chloride flush, naphazoline-glycerin, ondansetron (ZOFRAN) IV, oxyCODONE, polyethylene glycol, potassium chloride, sodium  chloride flush, traMADol   Antibiotics   Anti-infectives (From admission, onward)   Start     Dose/Rate Route Frequency Ordered Stop   11/02/17 1800  vancomycin (VANCOCIN) 1,250 mg in sodium chloride 0.9 % 250 mL IVPB     1,250 mg 166.7 mL/hr over 90 Minutes Intravenous Every 12 hours 11/02/17 0210     11/02/17 0300  vancomycin (VANCOCIN) 1,500 mg in sodium chloride 0.9 % 500 mL IVPB  Status:  Discontinued     1,500 mg 250 mL/hr over 120 Minutes Intravenous  Once 11/02/17 0210 11/02/17 1348   11/02/17 0200  piperacillin-tazobactam (ZOSYN) IVPB 3.375 g     3.375 g 12.5 mL/hr over 240 Minutes Intravenous Every 8 hours 11/02/17 0146          Subjective:   Colleen Kim was seen and examined today.  Feeling better today, alert and oriented, pain 5/10, on PCA.  No fevers or chills, shortness of breath is improving.  Feeling constipated.  No nausea vomiting.  Objective:   Vitals:   11/04/17 0036 11/04/17 0300 11/04/17 0356 11/04/17 0800  BP:  132/87    Pulse:  70    Resp: 20 (!) 24 20 16   Temp:  97.7 F (36.5 C)    TempSrc:  Oral    SpO2: 96% 99% 96%   Weight:      Height:        Intake/Output Summary (Last 24 hours) at 11/04/2017 1028 Last data filed at 11/04/2017 1000 Gross per 24 hour  Intake 900.8 ml  Output 1287 ml  Net -386.2 ml     Wt Readings from Last 3 Encounters:  11/02/17 93.5 kg     Exam    General: Alert and oriented x 3, NAD  Eyes:   HEENT  Cardiovascular: S1 S2 clear, RRR, No pedal edema b/l  Respiratory: Decreased breath sound at the base  Gastrointestinal: Soft, nontender, nondistended, + bowel sounds  Ext: no pedal edema bilaterally  Neuro: no new FND  Musculoskeletal: No digital cyanosis, clubbing  Skin: Wound clean and dry  Psych: Normal affect and demeanor, alert and oriented x3    Data Reviewed:  I have personally reviewed following labs and imaging studies  Micro Results Recent Results (from the past 240 hour(s))   MRSA PCR Screening     Status: None   Collection Time: 11/02/17  1:29 AM  Result Value Ref Range Status   MRSA by PCR NEGATIVE NEGATIVE Final    Comment:        The GeneXpert MRSA Assay (FDA approved for NASAL specimens only), is one component of a comprehensive MRSA colonization surveillance program. It is not intended to diagnose MRSA infection nor to guide or monitor treatment for MRSA infections. Performed at [facility], [place] [address], [place], [place] [address]  Culture, respiratory     Status: None   Collection Time: 11/02/17  9:47 AM  Result Value Ref Range Status   Specimen Description BRONCHIAL WASHINGS  Final   Special Requests NONE  Final   Gram Stain   Final    RARE WBC PRESENT, PREDOMINANTLY PMN RARE GRAM POSITIVE COCCI Performed at [facility], [place] [address] [place], [place] [address]    Culture FEW METHICILLIN RESISTANT STAPHYLOCOCCUS AUREUS  Final   Report Status 11/04/2017 FINAL  Final   Organism ID, Bacteria METHICILLIN RESISTANT STAPHYLOCOCCUS AUREUS  Final      Susceptibility   Methicillin resistant staphylococcus aureus - MIC*    CIPROFLOXACIN >=8 RESISTANT Resistant     ERYTHROMYCIN >=8 RESISTANT Resistant     GENTAMICIN <=0.5 SENSITIVE Sensitive     OXACILLIN >=4 RESISTANT Resistant     TETRACYCLINE <=1 SENSITIVE Sensitive     VANCOMYCIN <=0.5 SENSITIVE Sensitive     TRIMETH/SULFA <=10 SENSITIVE Sensitive     CLINDAMYCIN <=0.25 SENSITIVE Sensitive     RIFAMPIN <=0.5 SENSITIVE Sensitive     Inducible Clindamycin NEGATIVE Sensitive     * FEW METHICILLIN RESISTANT STAPHYLOCOCCUS AUREUS  Acid Fast Smear (AFB)     Status: None   Collection Time: 11/02/17  9:47 AM  Result Value Ref Range Status   AFB Specimen Processing Concentration  Final   Acid Fast Smear Negative  Final    Comment: (NOTE) Performed At: [facility] [place], [place] [id number] Rush Farmer MD HQ:[id number]    Source (AFB) PLEURAL   Final    Comment: RIGHT  Aerobic/Anaerobic Culture (surgical/deep wound)     Status: None (Preliminary result)   Collection Time: 11/02/17 10:29 AM  Result Value Ref Range Status   Specimen Description PLEURAL RIGTH  Final   Special Requests ZOCYN VANCOMYCIN  Final   Gram Stain   Final    RARE WBC PRESENT, PREDOMINANTLY PMN NO ORGANISMS SEEN    Culture   Final    NO GROWTH 1 DAY Performed at [facility], [address] [place], [place] [address]    Report Status PENDING  Incomplete  Culture, fungus without smear     Status: None (Preliminary result)   Collection Time: 11/02/17 11:00 AM  Result Value Ref Range Status   Specimen Description PLEURAL  Final   Special Requests RIGHT  Final   Culture   Final    NO GROWTH < 12 HOURS Performed at [facility], [place] [address] [place], [place] [address]    Report Status PENDING  Incomplete  Acid Fast Smear (AFB)     Status: None   Collection Time: 11/02/17 11:00 AM  Result Value Ref Range Status   AFB Specimen Processing Concentration  Final   Acid Fast Smear Negative  Final    Comment: (NOTE) Performed At: [facility] [place], [place] [id number] Rush Farmer MD KV:[id number]    Source (AFB) PLEURAL  Final    Comment: RIGHT  Aerobic/Anaerobic Culture (surgical/deep wound)     Status: None (Preliminary result)   Collection Time: 11/02/17 11:30 AM  Result Value Ref Range Status   Specimen Description PLEURAL  Final   Special Requests RIGHT  Final   Gram Stain   Final    FEW WBC PRESENT,BOTH PMN AND MONONUCLEAR NO ORGANISMS SEEN    Culture   Final    CULTURE REINCUBATED FOR BETTER GROWTH Performed at [facility],  [address] [place], [place] [address]    Report Status PENDING  Incomplete  Body fluid culture     Status: None (Preliminary result)   Collection Time: 11/03/17  1:05 PM  Result Value Ref Range Status   Specimen Description FLUID PLEURAL LEFT  Final   Special  Requests NONE  Final   Gram Stain   Final    RARE WBC PRESENT, PREDOMINANTLY PMN NO ORGANISMS SEEN    Culture   Final    NO GROWTH < 24 HOURS Performed at [facility], [place] [address], [place], [place] [address]    Report Status PENDING  Incomplete    Radiology Reports Dg Chest 1 View  Result Date: 11/03/2017 CLINICAL DATA:  Shortness of breath. Status post thoracentesis today. EXAM: CHEST  1 VIEW COMPARISON:  Single-view of the chest earlier today. FINDINGS: Two right chest tubes and right IJ central venous catheter are unchanged. No pneumothorax is identified. Small bilateral pleural effusions and basilar airspace disease appear unchanged. Heart size is upper normal. IMPRESSION: Negative for pneumothorax after thoracentesis. No change in small bilateral pleural effusions and basilar airspace disease. Electronically Signed   By: Inge Rise M.D.   On: 11/03/2017 13:20   Dg Chest 2 View  Result Date: 11/02/2017 CLINICAL DATA:  Follow-up of pneumonia. EXAM: CHEST - 2 VIEW COMPARISON:  CT 10/31/2017.  Plain film 10/28/2017. FINDINGS: Right larger than left pleural effusions, including loculated right-sided effusion posteriorly, similar to on the prior radiograph. No pneumothorax. Midline trachea. Normal heart size. Similar aeration, with right greater than left basilar predominant airspace disease. IMPRESSION: No real change since 11/07/2017 Right greater than left pleural effusions with lower lobe predominant airspace disease. The right-sided effusion demonstrates loculation posteriorly, as on CT. Electronically Signed   By: Abigail Miyamoto M.D.   On: 11/02/2017 08:41   Dg Chest Port 1 View  Result Date: 11/03/2017 CLINICAL DATA:  Follow-up empyema with chest tube drainage EXAM: PORTABLE CHEST 1 VIEW COMPARISON:  Portable chest x-ray of November 02, 2017 FINDINGS: The lungs remain hypoinflated. Bibasilar interstitial density persists. The left hemidiaphragm is less well demonstrated.  The right-sided chest tubes are in stable position. The right internal jugular venous catheter tip projects over the midportion of the SVC. The cardiac silhouette is enlarged. The pulmonary vascularity is normal. There are small bilateral pleural effusions layering posteriorly. IMPRESSION: Stable appearance of the chest. No pneumothorax. Persistent bibasilar interstitial densities slightly more conspicuous on the left today. Stable small bilateral pleural effusions layering posteriorly. Electronically Signed   By: David  Martinique M.D.   On: 11/03/2017 07:39   Dg Chest Port 1 View  Result Date: 11/02/2017 CLINICAL DATA:  Status post right VATS EXAM: PORTABLE CHEST 1 VIEW COMPARISON:  11/02/2017 FINDINGS: Cardiac shadow is stable. Right jugular central line is noted. Right-sided thoracostomy catheters are now seen as well as significant evacuation of previously noted right pleural effusion. Some atelectatic changes are noted in the base on the right. Stable atelectatic changes in the left base are noted. No pneumothorax is seen. IMPRESSION: Status post VATS on the right with chest tube placement. No pneumothorax is noted. Improved aeration in the right base with some residual atelectasis although the pleural effusion has resolved. Persistent left basilar atelectasis is noted. Electronically Signed   By: Inez Catalina M.D.   On: 11/02/2017 14:38   [place] Ekg Site Rite  Result Date: 11/02/2017 If Site Rite image not attached, placement could not be confirmed due to  current cardiac rhythm.  Ir Thoracentesis Asp Pleural Space W/img Guide  Result Date: 11/03/2017 INDICATION: Right-sided empyema with chest tube in place. Left pleural effusion. Request for diagnostic and therapeutic thoracentesis. EXAM: ULTRASOUND GUIDED LEFT THORACENTESIS MEDICATIONS: 1% lidocaine 10 mL and 2% lidocaine 5 mL COMPLICATIONS: None immediate. PROCEDURE: An ultrasound guided thoracentesis was thoroughly discussed with the patient and  questions answered. The benefits, risks, alternatives and complications were also discussed. The patient understands and wishes to proceed with the procedure. Written consent was obtained. Ultrasound was performed to localize and mark an adequate pocket of fluid in the left chest. The area was then prepped and draped in the normal sterile fashion. 1% Lidocaine was used for local anesthesia. Under ultrasound guidance a 6 Fr Safe-T-Centesis catheter was introduced. I was unable to withdraw any fluid. I then proceeded to use a 19 gauge 7-cm Yueh catheter. I was only able to obtain about 3 mL of clear yellow fluid. The catheter was removed and a dressing applied. FINDINGS: A total of approximately 3 mL of clear yellow fluid was removed. Sample was sent to the laboratory as requested by the clinical team. IMPRESSION: Successful ultrasound guided left thoracentesis yielding 3 mL of pleural fluid. No pneumothorax on post-procedure chest x-ray. Read by: Gareth Eagle, PA-C Electronically Signed   By: Lucrezia Europe M.D.   On: 11/03/2017 14:05    Lab Data:  CBC: Recent Labs  Lab 11/02/17 0246 11/03/17 0400 11/04/17 0412  WBC 20.7* 22.2* 14.0*  HGB 11.5* 9.9* 10.0*  HCT 36.2 32.2* 31.9*  MCV 94.0 93.9 94.9  PLT 376 390 902   Basic Metabolic Panel: Recent Labs  Lab 11/02/17 0246 11/03/17 0400 11/04/17 0412  NA 135 133* 136  K 4.1 4.3 4.2  CL 90* 96* 100  CO2 34* 30 30  GLUCOSE 159* 241* 161*  BUN 7 14 15   CREATININE 0.76 0.71 0.85  CALCIUM 8.8* 8.4* 8.5*   GFR: Estimated Creatinine Clearance: 97.5 mL/min (by C-G formula based on SCr of 0.85 mg/dL). Liver Function Tests: Recent Labs  Lab 11/02/17 0246 11/04/17 0412  AST 34 22  ALT 71* 50*  ALKPHOS 150* 101  BILITOT 0.3 0.4  PROT 7.3 6.2*  ALBUMIN 2.2* 2.0*   No results for input(s): LIPASE, AMYLASE in the last 168 hours. No results for input(s): AMMONIA in the last 168 hours. Coagulation Profile: Recent Labs  Lab 11/02/17 0246  INR  1.12   Cardiac Enzymes: No results for input(s): CKTOTAL, CKMB, CKMBINDEX, TROPONINI in the last 168 hours. BNP (last 3 results) No results for input(s): PROBNP in the last 8760 hours. HbA1C: Recent Labs    11/02/17 0246 11/03/17 0400  HGBA1C 7.4* 7.3*   CBG: Recent Labs  Lab 11/03/17 1200 11/03/17 1746 11/03/17 2222 11/04/17 0016 11/04/17 0813  GLUCAP 218* 141* 214* 222* 135*   Lipid Profile: No results for input(s): CHOL, HDL, LDLCALC, TRIG, CHOLHDL, LDLDIRECT in the last 72 hours. Thyroid Function Tests: No results for input(s): TSH, T4TOTAL, FREET4, T3FREE, THYROIDAB in the last 72 hours. Anemia Panel: No results for input(s): VITAMINB12, FOLATE, FERRITIN, TIBC, IRON, RETICCTPCT in the last 72 hours. Urine analysis: No results found for: COLORURINE, APPEARANCEUR, LABSPEC, PHURINE, GLUCOSEU, HGBUR, BILIRUBINUR, KETONESUR, PROTEINUR, UROBILINOGEN, NITRITE, LEUKOCYTESUR   Colleen Kim M.D. [facility] 11/04/2017, 10:28 AM  Pager: [phone] Between 7am to 7pm - call Pager - [phone]  After 7pm go to [email or web address] - password [email or web address]  Call night coverage person covering after 7pm

## 2017-11-04 NOTE — Progress Notes (Signed)
Anterior chest tube removed per providers order.  New dressing applied.  Patient tolerated well.Colleen Kim

## 2017-11-04 NOTE — Progress Notes (Signed)
Wasted 2 mL Fentanyl PCA with Kandis Nab, RN in the sink.

## 2017-11-04 NOTE — Progress Notes (Addendum)
      [address]       [place] [address]             [phone]    2 Days Post-Op Procedure(s) (LRB): VIDEO ASSISTED THORACOSCOPY Right(VATS)/DRAINAGE EMPYEMA AND DECORticaATION. with Electronic Colleen Kim done for count verification (Right) VIDEO BRONCHOSCOPY (N/A) Subjective: Feels okay this morning. Pain better controlled.   Objective: Vital signs in last 24 hours: Temp:  [97.7 F (36.5 C)-98.7 F (37.1 C)] 97.7 F (36.5 C) (10/15 0300) Pulse Rate:  [67-76] 70 (10/15 0300) Cardiac Rhythm: Normal sinus rhythm (10/15 0700) Resp:  [15-24] 20 (10/15 0356) BP: (98-132)/(57-87) 132/87 (10/15 0300) SpO2:  [96 %-99 %] 96 % (10/15 0356)     Intake/Output from previous day: 10/14 0701 - 10/15 0700 In: 1140.8 [P.O.:840; I.V.:10; IV Piggyback:290.8] Out: 1430 [Urine:1300; Chest Tube:128] Intake/Output this shift: No intake/output data recorded.  General appearance: alert, cooperative and no distress Heart: regular rate and rhythm, S1, S2 normal, no murmur, click, rub or gallop Lungs: clear to auscultation bilaterally and some rhonchi throughout Abdomen: soft, non-tender; bowel sounds normal; no masses,  no organomegaly Extremities: extremities normal, atraumatic, no cyanosis or edema Wound: clean and dry  Lab Results: Recent Labs    11/03/17 0400 11/04/17 0412  WBC 22.2* 14.0*  HGB 9.9* 10.0*  HCT 32.2* 31.9*  PLT 390 388   BMET:  Recent Labs    11/03/17 0400 11/04/17 0412  NA 133* 136  K 4.3 4.2  CL 96* 100  CO2 30 30  GLUCOSE 241* 161*  BUN 14 15  CREATININE 0.71 0.85  CALCIUM 8.4* 8.5*    PT/INR:  Recent Labs    11/02/17 0246  LABPROT 14.3  INR 1.12   ABG    Component Value Date/Time   PHART 7.400 11/03/2017 0355   HCO3 32.1 (H) 11/03/2017 0355   O2SAT 92.2 11/03/2017 0355   CBG (last 3)  Recent Labs    11/03/17 1746 11/03/17 2222 11/04/17 0016  GLUCAP 141* 214* 222*    Assessment/Plan: S/P Procedure(s) (LRB): VIDEO  ASSISTED THORACOSCOPY Right(VATS)/DRAINAGE EMPYEMA AND DECORticaATION. with Electronic Colleen Kim done for count verification (Right) VIDEO BRONCHOSCOPY (N/A)  1. CV-NSR in the 60s, BP well controlled. Continue Lopressor. 2. Pulm-tolerating room air with good oxygenation. Encouraged incentive spirometer. CXR showed no pneumothorax and no air leak. Will remove anterior chest tube today. Thoracentesis yielded very little-sent for culture.   3. Renal-creatinine 0.85, electrolytes okay 4. Antibiotics-continue Vanc and Zosyn per Medicine-cultures pending 5. H and H stable at 10.0/31.9 expected acute blood loss anemia  Plan: Remove anterior chest tube. Continue to ambulate in the halls. Continue PCA until all chest tubes removed. Continue to encourage incentive spirometer.   LOS: 3 days    Colleen Kim 11/04/2017  Seen with Dr Colleen Kim this am Progressing D/c one chest tube  I have seen and examined Colleen Kim and agree with the above assessment  and plan.  Colleen Isaac MD [facility] [phone] Office [phone] 11/04/2017 1:59 PM

## 2017-11-04 NOTE — Progress Notes (Signed)
Pharmacy Antibiotic Note  Colleen Kim is a 42 y.o. female transferred from [facility] on 11/01/2017 with empyema.  Pharmacy has been consulted for Vancomycin and Zosyn dosing.  Vancomycin level came back therapeutic at 15 approximately 11.5 hours. Scr up slightly to 0.85. WBC down to 14 this morning.    Plan: Continue vancomycin 1250 mg IV every 12 hours  Zosyn 3.375 g IV q8h   Height: 5\' 4"  (162.6 cm) Weight: 206 lb 3.2 oz (93.5 kg) IBW/kg (Calculated) : 54.7  Temp (24hrs), Avg:98.2 F (36.8 C), Min:97.7 F (36.5 C), Max:98.8 F (37.1 C)  Recent Labs  Lab 11/02/17 0246 11/03/17 0400 11/04/17 0412 11/04/17 1730  WBC 20.7* 22.2* 14.0*  --   CREATININE 0.76 0.71 0.85  --   VANCOTROUGH  --   --   --  15    Estimated Creatinine Clearance: 97.5 mL/min (by C-G formula based on SCr of 0.85 mg/dL).    Allergies  Allergen Reactions  . Sulfa Antibiotics Hives  . Iodine Hives    Doylene Canard, PharmD Clinical Pharmacist  Pager: [phone] Phone: [phone] 11/04/2017 8:16 PM

## 2017-11-05 ENCOUNTER — Inpatient Hospital Stay (HOSPITAL_COMMUNITY): Payer: Self-pay

## 2017-11-05 LAB — GLUCOSE, CAPILLARY
GLUCOSE-CAPILLARY: 172 mg/dL — AB (ref 70–99)
Glucose-Capillary: 157 mg/dL — ABNORMAL HIGH (ref 70–99)
Glucose-Capillary: 231 mg/dL — ABNORMAL HIGH (ref 70–99)

## 2017-11-05 LAB — CBC
HEMATOCRIT: 35.6 % — AB (ref 36.0–46.0)
Hemoglobin: 11.3 g/dL — ABNORMAL LOW (ref 12.0–15.0)
MCH: 29.7 pg (ref 26.0–34.0)
MCHC: 31.7 g/dL (ref 30.0–36.0)
MCV: 93.7 fL (ref 80.0–100.0)
NRBC: 0 % (ref 0.0–0.2)
Platelets: 173 10*3/uL (ref 150–400)
RBC: 3.8 MIL/uL — AB (ref 3.87–5.11)
RDW: 12.8 % (ref 11.5–15.5)
WBC: 12.6 10*3/uL — AB (ref 4.0–10.5)

## 2017-11-05 LAB — BASIC METABOLIC PANEL
ANION GAP: 9 (ref 5–15)
BUN: 10 mg/dL (ref 6–20)
CO2: 32 mmol/L (ref 22–32)
Calcium: 8.8 mg/dL — ABNORMAL LOW (ref 8.9–10.3)
Chloride: 96 mmol/L — ABNORMAL LOW (ref 98–111)
Creatinine, Ser: 0.85 mg/dL (ref 0.44–1.00)
GLUCOSE: 141 mg/dL — AB (ref 70–99)
POTASSIUM: 4.2 mmol/L (ref 3.5–5.1)
Sodium: 137 mmol/L (ref 135–145)

## 2017-11-05 MED ORDER — DIPHENHYDRAMINE HCL 25 MG PO CAPS: 25.0000 mg | ORAL_CAPSULE | ORAL | Status: DC | PRN

## 2017-11-05 MED ORDER — DIPHENHYDRAMINE HCL 25 MG PO CAPS
50.0000 mg | ORAL_CAPSULE | Freq: Once | ORAL | Status: AC
  Administered 2017-11-05: 50 mg via ORAL
  Filled 2017-11-05: qty 2

## 2017-11-05 NOTE — Progress Notes (Signed)
PROGRESS NOTE  Tavon Corriher MGQ:[id number] DOB: 1975/10/30 DOA: 11/01/2017 PCP: Patient, No Pcp Per  HPI/Recap of past 24 hours:  Remain on pca pump for pain control , one chest tube remains in place that likely able to remove today per thoracic surgery She is on room air at rest, reports feeling better overall, though not back to baseline No edema, no fever  Assessment/Plan: Principal Problem:   Empyema ([place]) Active Problems:   Diabetes mellitus without complication ([place])   Asthma   Empyema lung ([place])   Pressure injury of skin  Empyema ([place]) with community-acquired pneumonia, with small amount of hemoptysis -Continue IV vancomycin and Zosyn -CT surgery was consulted, patient underwent VATS drainage of right empyema and decortication on 10/13 -chest tube  management per thoracic surgery -sputum culture + MRSA, right pleural fluids culutre + ecoli and mrsa, Blood cultures at [facility] negative -she has been on vanc/zosyn since hospitalization, consider transition to doxycycline and augmentin tomorrow( she has h/o bactrim allergy) -wbc 12.6 today, repeat in am -she is improving, will d/c tele  left sided pleural effusion: S/p Successful ultrasound guided left thoracentesis yielding 3 mL of pleural fluid on 10/14 by IR Culture from left pleural effusion no growth  Mild elevated lft:  Repeat in am , will check hepatitis panel HIV screening negative  Active Problems:   Diabetes mellitus uncontrolled with hyperglycemia, on metformin at home -- hemoglobin A1c 7.3 -am CBG in 140 today -Placed on sliding scale insulin, moderate, night coverage -plan to resume metformin at discharge    Asthma -Continue duo nebs, currently no wheezing  No insurance, no pcp, case manager consulted  Code Status: full  Family Communication: patient   Disposition Plan: home in 1-2 days ,need thoracic surgery clearance   Consultants:  Thoracic surgery  IR  Procedures: VIDEO  ASSISTED THORACOSCOPY Right(VATS)/DRAINAGE EMPYEMA AND [place]. with Electronic Lawernce Keas done for count verification (Right) VIDEO BRONCHOSCOPY on 10/13 Successful ultrasound guided left thoracentesis yielding 3 mL of pleural fluid on 10/14 Antibiotics:  vanc/zosyn   Objective: BP 131/80 (BP Location: Right Arm)   Pulse 70   Temp 98.2 F (36.8 C) (Oral)   Resp 20   Ht 5\' 4"  (1.626 m)   Wt 93.5 kg   SpO2 95%   BMI 35.39 kg/m   Intake/Output Summary (Last 24 hours) at 11/05/2017 1017 Last data filed at 11/05/2017 0511 Gross per 24 hour  Intake 250 ml  Output 1815 ml  Net -1565 ml   Filed Weights   11/02/17 0200 11/02/17 0411  Weight: 93.4 kg 93.5 kg    Exam: Patient is examined daily including today on 11/05/2017, exams remain the same as of yesterday except that has changed    General:  NAD  Cardiovascular: RRR  Respiratory: chest tube on the right, diminished at basis, no wheezing  Abdomen: Soft/ND/NT, positive BS  Musculoskeletal: No Edema  Neuro: alert, oriented   Data Reviewed: Basic Metabolic Panel: Recent Labs  Lab 11/02/17 0246 11/03/17 0400 11/04/17 0412 11/05/17 0441  NA 135 133* 136 137  K 4.1 4.3 4.2 4.2  CL 90* 96* 100 96*  CO2 34* 30 30 32  GLUCOSE 159* 241* 161* 141*  BUN 7 14 15 10   CREATININE 0.76 0.71 0.85 0.85  CALCIUM 8.8* 8.4* 8.5* 8.8*   Liver Function Tests: Recent Labs  Lab 11/02/17 0246 11/04/17 0412  AST 34 22  ALT 71* 50*  ALKPHOS 150* 101  BILITOT 0.3 0.4  PROT 7.3 6.2*  ALBUMIN 2.2* 2.0*   No results for input(s): LIPASE, AMYLASE in the last 168 hours. No results for input(s): AMMONIA in the last 168 hours. CBC: Recent Labs  Lab 11/02/17 0246 11/03/17 0400 11/04/17 0412 11/05/17 0441  WBC 20.7* 22.2* 14.0* 12.6*  HGB 11.5* 9.9* 10.0* 11.3*  HCT 36.2 32.2* 31.9* 35.6*  MCV 94.0 93.9 94.9 93.7  PLT 376 390 388 173   Cardiac Enzymes:   No results for input(s): CKTOTAL, CKMB, CKMBINDEX, TROPONINI  in the last 168 hours. BNP (last 3 results) No results for input(s): BNP in the last 8760 hours.  ProBNP (last 3 results) No results for input(s): PROBNP in the last 8760 hours.  CBG: Recent Labs  Lab 11/04/17 0813 11/04/17 1204 11/04/17 1717 11/04/17 2131 11/05/17 0744  GLUCAP 135* 125* 151* 148* 157*    Recent Results (from the past 240 hour(s))  MRSA PCR Screening     Status: None   Collection Time: 11/02/17  1:29 AM  Result Value Ref Range Status   MRSA by PCR NEGATIVE NEGATIVE Final    Comment:        The GeneXpert MRSA Assay (FDA approved for NASAL specimens only), is one component of a comprehensive MRSA colonization surveillance program. It is not intended to diagnose MRSA infection nor to guide or monitor treatment for MRSA infections. Performed at [facility], [place] [address], [place], [place] [address]   Culture, respiratory     Status: None   Collection Time: 11/02/17  9:47 AM  Result Value Ref Range Status   Specimen Description BRONCHIAL WASHINGS  Final   Special Requests NONE  Final   Gram Stain   Final    RARE WBC PRESENT, PREDOMINANTLY PMN RARE GRAM POSITIVE COCCI Performed at [facility], [place] [address], [place], [place] [address]    Culture FEW METHICILLIN RESISTANT STAPHYLOCOCCUS AUREUS  Final   Report Status 11/04/2017 FINAL  Final   Organism ID, Bacteria METHICILLIN RESISTANT STAPHYLOCOCCUS AUREUS  Final      Susceptibility   Methicillin resistant staphylococcus aureus - MIC*    CIPROFLOXACIN >=8 RESISTANT Resistant     ERYTHROMYCIN >=8 RESISTANT Resistant     GENTAMICIN <=0.5 SENSITIVE Sensitive     OXACILLIN >=4 RESISTANT Resistant     TETRACYCLINE <=1 SENSITIVE Sensitive     VANCOMYCIN <=0.5 SENSITIVE Sensitive     TRIMETH/SULFA <=10 SENSITIVE Sensitive     CLINDAMYCIN <=0.25 SENSITIVE Sensitive     RIFAMPIN <=0.5 SENSITIVE Sensitive     Inducible Clindamycin NEGATIVE Sensitive     * FEW METHICILLIN RESISTANT  STAPHYLOCOCCUS AUREUS  Acid Fast Smear (AFB)     Status: None   Collection Time: 11/02/17  9:47 AM  Result Value Ref Range Status   AFB Specimen Processing Concentration  Final   Acid Fast Smear Negative  Final    Comment: (NOTE) Performed At: [facility] [place], [place] [id number] Rush Farmer MD VO:[id number]    Source (AFB) PLEURAL  Final    Comment: RIGHT  Aerobic/Anaerobic Culture (surgical/deep wound)     Status: None (Preliminary result)   Collection Time: 11/02/17 10:29 AM  Result Value Ref Range Status   Specimen Description PLEURAL RIGTH  Final   Special Requests ZOCYN VANCOMYCIN  Final   Gram Stain   Final    RARE WBC PRESENT, PREDOMINANTLY PMN NO ORGANISMS SEEN    Culture   Final    NO GROWTH 2 DAYS NO ANAEROBES ISOLATED;  CULTURE IN PROGRESS FOR 5 DAYS Performed at [facility], [place] [address] [place], [place] [address]    Report Status PENDING  Incomplete  Culture, fungus without smear     Status: None (Preliminary result)   Collection Time: 11/02/17 11:00 AM  Result Value Ref Range Status   Specimen Description PLEURAL  Final   Special Requests RIGHT  Final   Culture   Final    NO FUNGUS ISOLATED AFTER 2 DAYS Performed at [place] [address] [place], [place] [address]    Report Status PENDING  Incomplete  Acid Fast Smear (AFB)     Status: None   Collection Time: 11/02/17 11:00 AM  Result Value Ref Range Status   AFB Specimen Processing Concentration  Final   Acid Fast Smear Negative  Final    Comment: (NOTE) Performed At: [facility] [place], [place] [id number] Rush Farmer MD XT:[id number]    Source (AFB) PLEURAL  Final    Comment: RIGHT  Aerobic/Anaerobic Culture (surgical/deep wound)     Status: None (Preliminary result)   Collection Time: 11/02/17 11:30 AM  Result Value Ref Range Status   Specimen Description PLEURAL  Final   Special Requests RIGHT  Final   Gram Stain    Final    FEW WBC PRESENT,BOTH PMN AND MONONUCLEAR NO ORGANISMS SEEN Performed at [facility], [address] [place], [place] [address]    Culture   Final    RARE GRAM NEGATIVE RODS RARE STAPHYLOCOCCUS AUREUS NO ANAEROBES ISOLATED; CULTURE IN PROGRESS FOR 5 DAYS    Report Status PENDING  Incomplete  Body fluid culture     Status: None (Preliminary result)   Collection Time: 11/03/17  1:05 PM  Result Value Ref Range Status   Specimen Description FLUID PLEURAL LEFT  Final   Special Requests NONE  Final   Gram Stain   Final    RARE WBC PRESENT, PREDOMINANTLY PMN NO ORGANISMS SEEN    Culture   Final    NO GROWTH 2 DAYS Performed at [facility], [place] [address] [place], [place] [address]    Report Status PENDING  Incomplete     Studies: Dg Chest Port 1 View  Result Date: 11/05/2017 CLINICAL DATA:  Status post thoracoscopy with empyema drainage 3 days ago. EXAM: PORTABLE CHEST 1 VIEW COMPARISON:  Portable chest x-ray of November 03, 2017 FINDINGS: The lungs are better inflated today. Bibasilar atelectatic changes have improved. A small right pleural effusion persists. The right chest tube tip projects over the posteromedial aspect of the right sixth rib. The right internal jugular venous catheter tip projects over the midportion of the SVC. The trachea is midline. The observed bony thorax is unremarkable. IMPRESSION: Improved aeration of both lungs. No pneumothorax nor large pleural effusion. Persistent bibasilar atelectasis with small right pleural effusion. Electronically Signed   By: David  Martinique M.D.   On: 11/05/2017 07:49    Scheduled Meds: . acetaminophen  1,000 mg Oral Q6H   Or  . acetaminophen (TYLENOL) oral liquid 160 mg/5 mL  1,000 mg Oral Q6H  . bisacodyl  10 mg Oral Daily  . famotidine  20 mg Oral Daily  . Influenza vac split quadrivalent PF  0.5 mL Intramuscular Tomorrow-1000  . insulin aspart  0-15 Units Subcutaneous TID WC  . insulin aspart  0-5 Units  Subcutaneous QHS  . metoprolol tartrate  50 mg Oral BID  . montelukast  10  mg Oral QHS  . pneumococcal 23 valent vaccine  0.5 mL Intramuscular Tomorrow-1000  . senna-docusate  1 tablet Oral BID  . sodium chloride flush  10-40 mL Intracatheter Q12H    Continuous Infusions: . sodium chloride    . piperacillin-tazobactam (ZOSYN)  IV 3.375 g (11/05/17 0211)  . potassium chloride    . vancomycin 1,250 mg (11/05/17 0513)     Time spent: 45mins I have personally reviewed and interpreted on  11/05/2017 daily labs, tele strips, imagings as discussed above under date review session and assessment and plans.  I reviewed all nursing notes, pharmacy notes, consultant notes,  vitals, pertinent old records  I have discussed plan of care as described above with RN , patient  on 11/05/2017   Florencia Reasons MD, PhD  [facility] Pager [phone]. If 7PM-7AM, please contact night-coverage at [email or web address], password [email or web address] [facility] 11/05/2017, 10:17 AM  LOS: 4 days

## 2017-11-05 NOTE — Progress Notes (Addendum)
[address]       [place] [address]             [phone]      3 Days Post-Op Procedure(s) (LRB): VIDEO ASSISTED THORACOSCOPY Right(VATS)/DRAINAGE EMPYEMA AND DECORticaATION. with Electronic Lawernce Keas done for count verification (Right) VIDEO BRONCHOSCOPY (N/A) Subjective: No issues overnight. She states that she feels a little better each day  Objective: Vital signs in last 24 hours: Temp:  [98 F (36.7 C)-98.8 F (37.1 C)] 98.2 F (36.8 C) (10/16 0310) Pulse Rate:  [66-86] 70 (10/16 0310) Cardiac Rhythm: Normal sinus rhythm (10/16 0700) Resp:  [15-24] 20 (10/16 0814) BP: (121-131)/(52-80) 131/80 (10/16 0310) SpO2:  [95 %-99 %] 95 % (10/16 0814)     Intake/Output from previous day: 10/15 0701 - 10/16 0700 In: 270 [P.O.:240; I.V.:30] Out: 1815 [Urine:1775; Chest Tube:40] Intake/Output this shift: No intake/output data recorded.  General appearance: alert, cooperative and no distress Heart: regular rate and rhythm, S1, S2 normal, no murmur, click, rub or gallop Lungs: clear to auscultation bilaterally Abdomen: soft, non-tender; bowel sounds normal; no masses,  no organomegaly Extremities: extremities normal, atraumatic, no cyanosis or edema Wound: clean and dry  Lab Results: Recent Labs    11/04/17 0412 11/05/17 0441  WBC 14.0* 12.6*  HGB 10.0* 11.3*  HCT 31.9* 35.6*  PLT 388 173   BMET:  Recent Labs    11/04/17 0412 11/05/17 0441  NA 136 137  K 4.2 4.2  CL 100 96*  CO2 30 32  GLUCOSE 161* 141*  BUN 15 10  CREATININE 0.85 0.85  CALCIUM 8.5* 8.8*    PT/INR: No results for input(s): LABPROT, INR in the last 72 hours. ABG    Component Value Date/Time   PHART 7.400 11/03/2017 0355   HCO3 32.1 (H) 11/03/2017 0355   O2SAT 92.2 11/03/2017 0355   CBG (last 3)  Recent Labs    11/04/17 1717 11/04/17 2131 11/05/17 0744  GLUCAP 151* 148* 157*   Recent Results (from the past 240 hour(s))  MRSA PCR Screening     Status: None    Collection Time: 11/02/17  1:29 AM  Result Value Ref Range Status   MRSA by PCR NEGATIVE NEGATIVE Final    Comment:        The GeneXpert MRSA Assay (FDA approved for NASAL specimens only), is one component of a comprehensive MRSA colonization surveillance program. It is not intended to diagnose MRSA infection nor to guide or monitor treatment for MRSA infections. Performed at [facility], [place] [address], [place], [place] [address]   Culture, respiratory     Status: None   Collection Time: 11/02/17  9:47 AM  Result Value Ref Range Status   Specimen Description BRONCHIAL WASHINGS  Final   Special Requests NONE  Final   Gram Stain   Final    RARE WBC PRESENT, PREDOMINANTLY PMN RARE GRAM POSITIVE COCCI Performed at [facility], [place] [address] [place],  [address]    Culture FEW METHICILLIN RESISTANT STAPHYLOCOCCUS AUREUS  Final   Report Status 11/04/2017 FINAL  Final   Organism ID, Bacteria METHICILLIN RESISTANT STAPHYLOCOCCUS AUREUS  Final      Susceptibility   Methicillin resistant staphylococcus aureus - MIC*    CIPROFLOXACIN >=8 RESISTANT Resistant     ERYTHROMYCIN >=8 RESISTANT Resistant     GENTAMICIN <=0.5 SENSITIVE Sensitive     OXACILLIN >=4 RESISTANT Resistant     TETRACYCLINE <=1 SENSITIVE  Sensitive     VANCOMYCIN <=0.5 SENSITIVE Sensitive     TRIMETH/SULFA <=10 SENSITIVE Sensitive     CLINDAMYCIN <=0.25 SENSITIVE Sensitive     RIFAMPIN <=0.5 SENSITIVE Sensitive     Inducible Clindamycin NEGATIVE Sensitive     * FEW METHICILLIN RESISTANT STAPHYLOCOCCUS AUREUS  Acid Fast Smear (AFB)     Status: None   Collection Time: 11/02/17  9:47 AM  Result Value Ref Range Status   AFB Specimen Processing Concentration  Final   Acid Fast Smear Negative  Final    Comment: (NOTE) Performed At: [facility] [address] [place], [place] [id number] Rush Farmer MD OA:[id number]    Source (AFB) PLEURAL  Final    Comment: RIGHT    Aerobic/Anaerobic Culture (surgical/deep wound)     Status: None (Preliminary result)   Collection Time: 11/02/17 10:29 AM  Result Value Ref Range Status   Specimen Description PLEURAL RIGTH  Final   Special Requests ZOCYN VANCOMYCIN  Final   Gram Stain   Final    RARE WBC PRESENT, PREDOMINANTLY PMN NO ORGANISMS SEEN    Culture   Final    NO GROWTH 2 DAYS NO ANAEROBES ISOLATED; CULTURE IN PROGRESS FOR 5 DAYS Performed at [facility], [address], [place], [place] [address]    Report Status PENDING  Incomplete  Culture, fungus without smear     Status: None (Preliminary result)   Collection Time: 11/02/17 11:00 AM  Result Value Ref Range Status   Specimen Description PLEURAL  Final   Special Requests RIGHT  Final   Culture   Final    NO FUNGUS ISOLATED AFTER 2 DAYS Performed at [place] [address] [place], [place] [address]    Report Status PENDING  Incomplete  Acid Fast Smear (AFB)     Status: None   Collection Time: 11/02/17 11:00 AM  Result Value Ref Range Status   AFB Specimen Processing Concentration  Final   Acid Fast Smear Negative  Final    Comment: (NOTE) Performed At: [facility] [place], [place] [id number] Rush Farmer MD CW:[id number]    Source (AFB) PLEURAL  Final    Comment: RIGHT  Aerobic/Anaerobic Culture (surgical/deep wound)     Status: None (Preliminary result)   Collection Time: 11/02/17 11:30 AM  Result Value Ref Range Status   Specimen Description PLEURAL  Final   Special Requests RIGHT  Final   Gram Stain   Final    FEW WBC PRESENT,BOTH PMN AND MONONUCLEAR NO ORGANISMS SEEN Performed at [facility], [address], [place], [place] [address]    Culture   Final    RARE GRAM NEGATIVE RODS RARE STAPHYLOCOCCUS AUREUS NO ANAEROBES ISOLATED; CULTURE IN PROGRESS FOR 5 DAYS    Report Status PENDING  Incomplete  Body fluid culture     Status: None (Preliminary result)   Collection Time:  11/03/17  1:05 PM  Result Value Ref Range Status   Specimen Description FLUID PLEURAL LEFT  Final   Special Requests NONE  Final   Gram Stain   Final    RARE WBC PRESENT, PREDOMINANTLY PMN NO ORGANISMS SEEN    Culture   Final    NO GROWTH < 24 HOURS Performed at [facility], [place] [address] [place], [place] [address]    Report Status PENDING  Incomplete   Assessment/Plan: S/P Procedure(s) (LRB): VIDEO ASSISTED THORACOSCOPY Right(VATS)/DRAINAGE EMPYEMA AND DECORticaATION. with Electronic Lawernce Keas done  for count verification (Right) VIDEO BRONCHOSCOPY (N/A)  1. CV-NSR in the 70s, BP well controlled. Continue Lopressor. 2. Pulm-tolerating room air with good oxygenation. Encouraged incentive spirometer. CXR showed improved aeration of both lungs. No pneumothorax nor large pleural effusion. Persistent bibasilar atelectasis with small right pleural effusion.Will remove anterior chest tube today. Thoracentesis yielded very little-sent for culture.  Only 40cc/24 hours out of remaining chest tube.  3. Renal-creatinine 0.85, electrolytes okay 4. Antibiotics-continue Vanc and Zosyn per Medicine-cultures pending. Now on contact precautions. Bronchial washings from 10/13 showed MRSA.  5. H and H stable at 11.3/35.6 expected acute blood loss anemia  Plan: CXR stable. Continue to ambulate in the halls. Continue PCA until all chest tubes removed. May be able to remove remaining chest tube today. Continue to encourage incentive spirometer.    LOS: 4 days    [name] 11/05/2017  Chest tube out I have seen and examined [name] and agree with the above assessment  and plan.  Grace Isaac MD [place] [phone] Office [phone] 11/05/2017 9:07 PM

## 2017-11-06 ENCOUNTER — Inpatient Hospital Stay (HOSPITAL_COMMUNITY): Payer: Self-pay

## 2017-11-06 LAB — CBC
HCT: 33.2 % — ABNORMAL LOW (ref 36.0–46.0)
HEMOGLOBIN: 10.1 g/dL — AB (ref 12.0–15.0)
MCH: 28.6 pg (ref 26.0–34.0)
MCHC: 30.4 g/dL (ref 30.0–36.0)
MCV: 94.1 fL (ref 80.0–100.0)
NRBC: 0 % (ref 0.0–0.2)
Platelets: 426 10*3/uL — ABNORMAL HIGH (ref 150–400)
RBC: 3.53 MIL/uL — ABNORMAL LOW (ref 3.87–5.11)
RDW: 12.7 % (ref 11.5–15.5)
WBC: 12.4 10*3/uL — AB (ref 4.0–10.5)

## 2017-11-06 LAB — GLUCOSE, CAPILLARY
GLUCOSE-CAPILLARY: 157 mg/dL — AB (ref 70–99)
Glucose-Capillary: 160 mg/dL — ABNORMAL HIGH (ref 70–99)
Glucose-Capillary: 138 mg/dL — ABNORMAL HIGH (ref 70–99)

## 2017-11-06 LAB — BASIC METABOLIC PANEL
ANION GAP: 6 (ref 5–15)
BUN: 8 mg/dL (ref 6–20)
CALCIUM: 8.4 mg/dL — AB (ref 8.9–10.3)
CHLORIDE: 101 mmol/L (ref 98–111)
CO2: 30 mmol/L (ref 22–32)
CREATININE: 0.75 mg/dL (ref 0.44–1.00)
GFR calc non Af Amer: >60 mL/min (ref >60)
Glucose, Bld: 130 mg/dL — ABNORMAL HIGH (ref 70–99)
Potassium: 3.7 mmol/L (ref 3.5–5.1)
SODIUM: 137 mmol/L (ref 135–145)

## 2017-11-06 LAB — HEPATIC FUNCTION PANEL
ALBUMIN: 2.2 g/dL — AB (ref 3.5–5.0)
ALT: 28 U/L (ref 0–44)
AST: 13 U/L — AB (ref 15–41)
Alkaline Phosphatase: 99 U/L (ref 38–126)
TOTAL PROTEIN: 6.6 g/dL (ref 6.5–8.1)
Total Bilirubin: 0.4 mg/dL (ref 0.3–1.2)

## 2017-11-06 LAB — TSH: TSH: 1.14 u[IU]/mL (ref 0.350–4.500)

## 2017-11-06 LAB — MAGNESIUM: MAGNESIUM: 2 mg/dL (ref 1.7–2.4)

## 2017-11-06 MED ORDER — MONTELUKAST SODIUM 10 MG PO TABS: 10.0000 mg | ORAL_TABLET | Freq: Every day | ORAL | 0 refills | Status: AC

## 2017-11-06 MED ORDER — AMOXICILLIN 500 MG PO TABS: 500.0000 mg | ORAL_TABLET | Freq: Three times a day (TID) | ORAL | 0 refills | Status: DC

## 2017-11-06 MED ORDER — AMOXICILLIN 500 MG PO TABS: 500.0000 mg | ORAL_TABLET | Freq: Three times a day (TID) | ORAL | 0 refills | Status: AC

## 2017-11-06 MED ORDER — CLINDAMYCIN HCL 300 MG PO CAPS: 300.0000 mg | ORAL_CAPSULE | Freq: Three times a day (TID) | ORAL | 0 refills | Status: DC

## 2017-11-06 MED ORDER — CLINDAMYCIN HCL 300 MG PO CAPS: 300.0000 mg | ORAL_CAPSULE | Freq: Three times a day (TID) | ORAL | 0 refills | Status: AC

## 2017-11-06 MED ORDER — SODIUM CHLORIDE 0.9 % IV BOLUS
500.0000 mL | Freq: Once | INTRAVENOUS | Status: AC
  Administered 2017-11-06: 500 mL via INTRAVENOUS

## 2017-11-06 MED ORDER — HYDROCODONE-ACETAMINOPHEN 5-325 MG PO TABS: 1.0000 | ORAL_TABLET | ORAL | 0 refills | Status: DC | PRN

## 2017-11-06 MED ORDER — METOPROLOL TARTRATE 50 MG PO TABS: 50.0000 mg | ORAL_TABLET | Freq: Two times a day (BID) | ORAL | 0 refills | Status: DC

## 2017-11-06 MED ORDER — IBUPROFEN 200 MG PO TABS
400.0000 mg | ORAL_TABLET | Freq: Once | ORAL | Status: AC
  Administered 2017-11-06: 400 mg via ORAL
  Filled 2017-11-06: qty 2

## 2017-11-06 MED ORDER — HYDROCODONE-ACETAMINOPHEN 5-325 MG PO TABS: 1.0000 | ORAL_TABLET | ORAL | 0 refills | Status: AC | PRN

## 2017-11-06 MED ORDER — METOPROLOL TARTRATE 50 MG PO TABS: 50.0000 mg | ORAL_TABLET | Freq: Two times a day (BID) | ORAL | 0 refills | Status: AC

## 2017-11-06 MED ORDER — MONTELUKAST SODIUM 10 MG PO TABS: 10.0000 mg | ORAL_TABLET | Freq: Every day | ORAL | 0 refills | Status: DC

## 2017-11-06 MED FILL — AMOXICILLIN 500 MG CAPS: 500 | 3 days supply | Qty: 9 | Fill #0

## 2017-11-06 MED FILL — METOPROLOL TARTRATE 50 MG T: 50 | 30 days supply | Qty: 60 | Fill #0

## 2017-11-06 MED FILL — MONTELUKAST SOD 10 MG TAB: 10 | 30 days supply | Qty: 30 | Fill #0

## 2017-11-06 MED FILL — CLINDAMYCIN HCL 300 MG CAP: 300 | 3 days supply | Qty: 9 | Fill #0

## 2017-11-06 MED FILL — HYDROCODON-APAP 5-325: 5-325 | 2 days supply | Qty: 5 | Fill #0

## 2017-11-06 NOTE — Progress Notes (Addendum)
Paged [facility] about 2 view or 1 view chest x ray for this morning. Awaiting response.  0300-Orders noted.

## 2017-11-06 NOTE — Progress Notes (Addendum)
Paged MD about patient's c/o left foot swelling and also feels like "pins and needles" 5/10. Positioned up on pillows while in bed. Also running a temp 101 tonight. Extra strength tylenol given per MD order. Paged MD [place] orders acknowledged and explained to patient. Ibuprofen given and left foot elevated on pillows. Bolus 548ml also started at this time.

## 2017-11-06 NOTE — Discharge Summary (Signed)
Discharge Summary  Colleen Kim QPY:[id number] DOB: October 29, 1975  PCP: Patient, No Pcp Per  Admit date: 11/01/2017 Discharge date: 11/06/2017  Time spent: 26mins, more than 50% time spent on coordination of care  Recommendations for Outpatient Follow-up:  1. F/u with PMD within a week  for hospital discharge follow up, repeat cbc/bmp at follow up. pcp to follow up on final hepatitis panel result 2. F/u with thoracic surgery  Discharge Diagnoses:  Active Hospital Problems   Diagnosis Date Noted  . Empyema ([place]) 11/02/2017  . Pressure injury of skin 11/03/2017  . Diabetes mellitus without complication ([place]) 12/45/8099  . Asthma 11/02/2017  . Empyema lung ([place]) 11/02/2017    Resolved Hospital Problems  No resolved problems to display.    Discharge Condition: stable  Diet recommendation: heart healthy/carb modified  Filed Weights   11/02/17 0200 11/02/17 0411  Weight: 93.4 kg 93.5 kg    History of present illness: (per admitting MD Dr Maudie Mercury) Colleen Kim  is a 42 y.o. female, w dm2, Asthma apparently admitted to [facility] for CAP on 10/24/2017, tx with Rocephin and Zithromax and started on steroids for her asthma.    CXR 10/24/2017  Impression Small bilateral pleural effusiona dn patchy basilar opacities which may represent associated atelectasis or pneumonia.   CT chest 10/24/2017 Impression No PE Pleural effusion bilaterally larger on the right than on the left.  Areas of lower lobe atelectatic change bilatyerally with consolidation in portions of each lower lobe more on the right than on the left in hthe ineferior lingula, R middle lobe atelectasis.  Fracture of the lateral right sixth and seventh ribs, no pneumothorax evident Mildly prominent lymph nodes of uncertain etiology present.     Her steroids were stopped on 10/27/2017.   Repeat CXR 10/28/2017 Impression pelural effusions appear larger than on recent studies.  There is progression of airspace  consolidation in the mid and lower lung zones likely due to alveolar edema although pneumonia superimposed cannot be excluded.  There is underlying pulmonary vascular congestion.  This combination of finding may well be indictaive of progression of CHF.   Pt was changed from rocephin to zosyn on 10/29/2017.    Blood cultures 10/24/2017 negative Respiratory viral panel negative.    Repeat CT chest 10/31/2017 Impression Bilateral pleural effusion larger on right, right appearing partially loculated and question left demonstrating partial loculation as well.   Subtotal atelectasis of bilateral lower lobes and right middle lobe  Mediastinal and suspected right hilar adenopathy, minimally more prominent on the previously exam  Minimal pericardial effusion.    11/01/2017 Wbc 17.6, Hgb 11.8, Plt 317  Na 131, K 4.0, Bun 11, Creatinine 0.60    Apparently due to lack of improvement and concern for empyema Dr. Anda Latina discussed case with pulmonary who recommended CT surgery consult and then discussed with CT surgery who recommended transfer to [facility] for further evaluation.    Hospital Course:  Principal Problem:   Empyema [facility]) Active Problems:   Diabetes mellitus without complication ([place])   Asthma   Empyema lung ([place])   Pressure injury of skin   Empyema ([place]) with community-acquired pneumonia, with small amount of hemoptysis -CT surgery was consulted, patient underwent VATS drainage of right empyema and decortication on 10/13 -chest tube removed -sputum culture + MRSA, right pleural fluids culutre + ecoli and mrsa, Blood cultures at [facility] negative -she has been on vanc/zosyn since hospitalization, she is discharged on clindamycin and amoxicillin for another three days to finish abx  treatment. ( she has h/o bactrim allergy) -she is cleared by thoracic surgery to discharge, she is to follow up with thoracic surgery.  left sided pleural effusion: S/p Successful  ultrasound guided left thoracentesis yielding 3 mL of pleural fluid on 10/14 by IR Culture from left pleural effusion no growth  Mild elevated lft:   hepatitis panel in process HIV screening negative lft normalized at discharge  Active Problems: Diabetes mellitus uncontrolled with hyperglycemia, on metformin at home -- hemoglobin A1c 7.3 -am CBG in 140 today -on ssi in the hospital -resume metformin at discharge  Asthma -Continue duo nebs, currently no wheezing  Smoker, smoking cessation education provided  No insurance, no pcp, case manager consulted She is scheduled to f/u with [facility].  Code Status: full  Family Communication: patient   Disposition Plan: home with thoracic surgery clearance   Consultants:  Thoracic surgery  IR  Procedures: VIDEO ASSISTED THORACOSCOPY Right(VATS)/DRAINAGE EMPYEMA AND DECORticaATION. with Electronic Lawernce Keas done for count verification (Right) VIDEO BRONCHOSCOPY on 10/13 Successful ultrasound guided left thoracentesis yielding 3 mL of pleural fluid on 10/14 Antibiotics:  vanc/zosyn   Discharge Exam: BP 114/62 (BP Location: Right Arm)   Pulse (!) 101   Temp 98 F (36.7 C) (Oral)   Resp 14   Ht 5\' 4"  (1.626 m)   Wt 93.5 kg   SpO2 94%   BMI 35.39 kg/m   General: NAD Cardiovascular: RRR Respiratory: CTABL  Discharge Instructions You were cared for by a hospitalist during your hospital stay. If you have any questions about your discharge medications or the care you received while you were in the hospital after you are discharged, you can call the unit and asked to speak with the hospitalist on call if the hospitalist that took care of you is not available. Once you are discharged, your primary care physician will handle any further medical issues. Please note that NO REFILLS for any discharge medications will be authorized once you are discharged, as it is imperative that  you return to your primary care physician (or establish a relationship with a primary care physician if you do not have one) for your aftercare needs so that they can reassess your need for medications and monitor your lab values.  Discharge Instructions    Diet general   Complete by:  As directed    Increase activity slowly   Complete by:  As directed      Allergies as of 11/06/2017      Reactions   Sulfa Antibiotics Hives   Iodine Hives      Medication List    STOP taking these medications   acetaminophen 500 MG tablet Commonly known as:  TYLENOL     TAKE these medications   albuterol (2.5 MG/3ML) 0.083% nebulizer solution Commonly known as:  PROVENTIL Take 2.5 mg by nebulization every 6 (six) hours as needed. For shortness of breath What changed:  Another medication with the same name was removed. Continue taking this medication, and follow the directions you see here.   amoxicillin 500 MG tablet Commonly known as:  AMOXIL Take 1 tablet (500 mg total) by mouth 3 (three) times daily for 3 days.   clindamycin 300 MG capsule Commonly known as:  CLEOCIN Take 1 capsule (300 mg total) by mouth 3 (three) times daily for 3 days.   diphenhydrAMINE 25 MG tablet Commonly known as:  BENADRYL Take 25 mg by mouth at bedtime as needed for sleep.  HYDROcodone-acetaminophen 5-325 MG tablet Commonly known as:  NORCO/VICODIN Take 1 tablet by mouth every 4 (four) hours as needed for severe pain. What changed:  reasons to take this   ibuprofen 200 MG tablet Commonly known as:  ADVIL,MOTRIN Take 400 mg by mouth every 6 (six) hours as needed for moderate pain.   ipratropium-albuterol 0.5-2.5 (3) MG/3ML Soln Commonly known as:  DUONEB Take 3 mLs by nebulization every 4 (four) hours as needed. For shortness of breath   metFORMIN 1000 MG tablet Commonly known as:  GLUCOPHAGE Take 1,000 mg by mouth 2 (two) times daily with a meal.   metoprolol tartrate 50 MG tablet Commonly known  as:  LOPRESSOR Take 1 tablet (50 mg total) by mouth 2 (two) times daily.   montelukast 10 MG tablet Commonly known as:  SINGULAIR Take 1 tablet (10 mg total) by mouth at bedtime.   ranitidine 300 MG tablet Commonly known as:  ZANTAC Take 300 mg by mouth 2 (two) times daily.      Allergies  Allergen Reactions  . Sulfa Antibiotics Hives  . Iodine Hives   Follow-up Information    Grace Isaac, MD Follow up.   Specialty:  Cardiothoracic Surgery Why:  Your routine post-op appointment is on 11/24/2017 at 1:00pm. Please arrive at 12:30pm for a chest xray located at [facility] which is on the first floor of our building. Your chest tube sutures will also be removed at this time.  Contact information: [address] [place] [address] [phone]        [facility] Follow up in 1 week(s).   Why:  hospital discharge follow up, establish primary care. repeat cbc/bmp at follow up Contact information: [address] [place] [address] [phone]           The results of significant diagnostics from this hospitalization (including imaging, microbiology, ancillary and laboratory) are listed below for reference.    Significant Diagnostic Studies: Dg Chest 1 View  Result Date: 11/03/2017 CLINICAL DATA:  Shortness of breath. Status post thoracentesis today. EXAM: CHEST  1 VIEW COMPARISON:  Single-view of the chest earlier today. FINDINGS: Two right chest tubes and right IJ central venous catheter are unchanged. No pneumothorax is identified. Small bilateral pleural effusions and basilar airspace disease appear unchanged. Heart size is upper normal. IMPRESSION: Negative for pneumothorax after thoracentesis. No change in small bilateral pleural effusions and basilar airspace disease. Electronically Signed   By: Inge Rise M.D.   On: 11/03/2017 13:20   Dg Chest 2 View  Result Date:  11/06/2017 CLINICAL DATA:  Thoracostomy with empyema drainage. EXAM: CHEST - 2 VIEW COMPARISON:  Chest x-ray 11/05/2017. FINDINGS: Right IJ line with tip over superior vena cava. Interim removal of right chest tube. Bibasilar atelectasis/infiltrates. Stable bilateral pleural effusions/pleural thickening, particularly posteriorly on the right. No pneumothorax. IMPRESSION: 1. Interim removal of right chest tube. No pneumothorax. Right IJ line noted with tip over superior vena cava. 2. Stable bibasilar atelectasis/infiltrates and bilateral pleural effusions particularly posteriorly on the right. No interim change from prior exam. Electronically Signed   By: Marcello Moores  Register   On: 11/06/2017 09:23   Dg Chest 2 View  Result Date: 11/02/2017 CLINICAL DATA:  Follow-up of pneumonia. EXAM: CHEST - 2 VIEW COMPARISON:  CT 10/31/2017.  Plain film 10/28/2017. FINDINGS: Right larger than left pleural effusions, including loculated right-sided effusion posteriorly, similar to on the prior radiograph. No pneumothorax. Midline trachea. Normal  heart size. Similar aeration, with right greater than left basilar predominant airspace disease. IMPRESSION: No real change since 11/07/2017 Right greater than left pleural effusions with lower lobe predominant airspace disease. The right-sided effusion demonstrates loculation posteriorly, as on CT. Electronically Signed   By: Abigail Miyamoto M.D.   On: 11/02/2017 08:41   Dg Chest Port 1 View  Result Date: 11/05/2017 CLINICAL DATA:  Status post thoracoscopy with empyema drainage 3 days ago. EXAM: PORTABLE CHEST 1 VIEW COMPARISON:  Portable chest x-ray of November 03, 2017 FINDINGS: The lungs are better inflated today. Bibasilar atelectatic changes have improved. A small right pleural effusion persists. The right chest tube tip projects over the posteromedial aspect of the right sixth rib. The right internal jugular venous catheter tip projects over the midportion of the SVC. The trachea  is midline. The observed bony thorax is unremarkable. IMPRESSION: Improved aeration of both lungs. No pneumothorax nor large pleural effusion. Persistent bibasilar atelectasis with small right pleural effusion. Electronically Signed   By: David  Martinique M.D.   On: 11/05/2017 07:49   Dg Chest Port 1 View  Result Date: 11/03/2017 CLINICAL DATA:  Follow-up empyema with chest tube drainage EXAM: PORTABLE CHEST 1 VIEW COMPARISON:  Portable chest x-ray of November 02, 2017 FINDINGS: The lungs remain hypoinflated. Bibasilar interstitial density persists. The left hemidiaphragm is less well demonstrated. The right-sided chest tubes are in stable position. The right internal jugular venous catheter tip projects over the midportion of the SVC. The cardiac silhouette is enlarged. The pulmonary vascularity is normal. There are small bilateral pleural effusions layering posteriorly. IMPRESSION: Stable appearance of the chest. No pneumothorax. Persistent bibasilar interstitial densities slightly more conspicuous on the left today. Stable small bilateral pleural effusions layering posteriorly. Electronically Signed   By: David  Martinique M.D.   On: 11/03/2017 07:39   Dg Chest Port 1 View  Result Date: 11/02/2017 CLINICAL DATA:  Status post right VATS EXAM: PORTABLE CHEST 1 VIEW COMPARISON:  11/02/2017 FINDINGS: Cardiac shadow is stable. Right jugular central line is noted. Right-sided thoracostomy catheters are now seen as well as significant evacuation of previously noted right pleural effusion. Some atelectatic changes are noted in the base on the right. Stable atelectatic changes in the left base are noted. No pneumothorax is seen. IMPRESSION: Status post VATS on the right with chest tube placement. No pneumothorax is noted. Improved aeration in the right base with some residual atelectasis although the pleural effusion has resolved. Persistent left basilar atelectasis is noted. Electronically Signed   By: Inez Catalina  M.D.   On: 11/02/2017 14:38   [place] Ekg Site Rite  Result Date: 11/02/2017 If Site Rite image not attached, placement could not be confirmed due to current cardiac rhythm.  Ir Thoracentesis Asp Pleural Space W/img Guide  Result Date: 11/03/2017 INDICATION: Right-sided empyema with chest tube in place. Left pleural effusion. Request for diagnostic and therapeutic thoracentesis. EXAM: ULTRASOUND GUIDED LEFT THORACENTESIS MEDICATIONS: 1% lidocaine 10 mL and 2% lidocaine 5 mL COMPLICATIONS: None immediate. PROCEDURE: An ultrasound guided thoracentesis was thoroughly discussed with the patient and questions answered. The benefits, risks, alternatives and complications were also discussed. The patient understands and wishes to proceed with the procedure. Written consent was obtained. Ultrasound was performed to localize and mark an adequate pocket of fluid in the left chest. The area was then prepped and draped in the normal sterile fashion. 1% Lidocaine was used for local anesthesia. Under ultrasound guidance a 6 Fr Safe-T-Centesis catheter was introduced. I was  unable to withdraw any fluid. I then proceeded to use a 19 gauge 7-cm Yueh catheter. I was only able to obtain about 3 mL of clear yellow fluid. The catheter was removed and a dressing applied. FINDINGS: A total of approximately 3 mL of clear yellow fluid was removed. Sample was sent to the laboratory as requested by the clinical team. IMPRESSION: Successful ultrasound guided left thoracentesis yielding 3 mL of pleural fluid. No pneumothorax on post-procedure chest x-ray. Read by: Gareth Eagle, PA-C Electronically Signed   By: Lucrezia Europe M.D.   On: 11/03/2017 14:05    Microbiology: Recent Results (from the past 240 hour(s))  MRSA PCR Screening     Status: None   Collection Time: 11/02/17  1:29 AM  Result Value Ref Range Status   MRSA by PCR NEGATIVE NEGATIVE Final    Comment:        The GeneXpert MRSA Assay (FDA approved for NASAL  specimens only), is one component of a comprehensive MRSA colonization surveillance program. It is not intended to diagnose MRSA infection nor to guide or monitor treatment for MRSA infections. Performed at [facility], [place] [address] [place], [place] [address]   Fungus Culture With Stain     Status: None (Preliminary result)   Collection Time: 11/02/17  9:47 AM  Result Value Ref Range Status   Fungus Stain Final report  Final    Comment: (NOTE) Performed At: [facility] [place], [place] [id number] Rush Farmer MD [id number]    Fungus (Mycology) Culture PENDING  Incomplete   Fungal Source PLEURAL  Final    Comment: RIGHT  Culture, respiratory     Status: None   Collection Time: 11/02/17  9:47 AM  Result Value Ref Range Status   Specimen Description BRONCHIAL WASHINGS  Final   Special Requests NONE  Final   Gram Stain   Final    RARE WBC PRESENT, PREDOMINANTLY PMN RARE GRAM POSITIVE COCCI Performed at [facility], [place] [address] [place], [place] [address]    Culture FEW METHICILLIN RESISTANT STAPHYLOCOCCUS AUREUS  Final   Report Status 11/04/2017 FINAL  Final   Organism ID, Bacteria METHICILLIN RESISTANT STAPHYLOCOCCUS AUREUS  Final      Susceptibility   Methicillin resistant staphylococcus aureus - MIC*    CIPROFLOXACIN >=8 RESISTANT Resistant     ERYTHROMYCIN >=8 RESISTANT Resistant     GENTAMICIN <=0.5 SENSITIVE Sensitive     OXACILLIN >=4 RESISTANT Resistant     TETRACYCLINE <=1 SENSITIVE Sensitive     VANCOMYCIN <=0.5 SENSITIVE Sensitive     TRIMETH/SULFA <=10 SENSITIVE Sensitive     CLINDAMYCIN <=0.25 SENSITIVE Sensitive     RIFAMPIN <=0.5 SENSITIVE Sensitive     Inducible Clindamycin NEGATIVE Sensitive     * FEW METHICILLIN RESISTANT STAPHYLOCOCCUS AUREUS  Acid Fast Smear (AFB)     Status: None   Collection Time: 11/02/17  9:47 AM  Result Value Ref Range Status   AFB Specimen Processing Concentration  Final   Acid  Fast Smear Negative  Final    Comment: (NOTE) Performed At: [facility] [address] [place], [place] [id number] Rush Farmer MD [id number]    Source (AFB) PLEURAL  Final    Comment: RIGHT  Fungus Culture Result     Status: None   Collection Time: 11/02/17  9:47 AM  Result Value Ref Range Status   Result 1 Comment  Final    Comment: (NOTE) KOH/Calcofluor preparation:  no fungus  observed. Performed At: [facility] [place], [place] [id number] Rush Farmer MD [id number]   Fungus Culture With Stain     Status: None (Preliminary result)   Collection Time: 11/02/17 10:29 AM  Result Value Ref Range Status   Fungus Stain Final report  Final    Comment: (NOTE) Performed At: [facility] [place], [place] [id number] Rush Farmer MD [id number]    Fungus (Mycology) Culture PENDING  Incomplete   Fungal Source PLEURAL  Final  Aerobic/Anaerobic Culture (surgical/deep wound)     Status: None (Preliminary result)   Collection Time: 11/02/17 10:29 AM  Result Value Ref Range Status   Specimen Description PLEURAL RIGTH  Final   Special Requests ZOCYN VANCOMYCIN  Final   Gram Stain   Final    RARE WBC PRESENT, PREDOMINANTLY PMN NO ORGANISMS SEEN    Culture   Final    NO GROWTH 4 DAYS NO ANAEROBES ISOLATED; CULTURE IN PROGRESS FOR 5 DAYS Performed at [facility], [address] [place], [place] [address]    Report Status PENDING  Incomplete  Fungus Culture Result     Status: None   Collection Time: 11/02/17 10:29 AM  Result Value Ref Range Status   Result 1 Comment  Final    Comment: (NOTE) KOH/Calcofluor preparation:  no fungus observed. Performed At: [facility] [place], [place] [id number] Rush Farmer MD [id number]   Culture, fungus without smear     Status: None (Preliminary result)   Collection Time: 11/02/17 11:00 AM  Result Value Ref Range Status   Specimen Description  PLEURAL  Final   Special Requests RIGHT  Final   Culture   Final    NO FUNGUS ISOLATED AFTER 3 DAYS Performed at [facility], [address], [place], [place] [address]    Report Status PENDING  Incomplete  Acid Fast Smear (AFB)     Status: None   Collection Time: 11/02/17 11:00 AM  Result Value Ref Range Status   AFB Specimen Processing Concentration  Final   Acid Fast Smear Negative  Final    Comment: (NOTE) Performed At: [facility] [place], [place] [id number] Rush Farmer MD [id number]    Source (AFB) PLEURAL  Final    Comment: RIGHT  Aerobic/Anaerobic Culture (surgical/deep wound)     Status: None (Preliminary result)   Collection Time: 11/02/17 11:30 AM  Result Value Ref Range Status   Specimen Description PLEURAL  Final   Special Requests RIGHT  Final   Gram Stain   Final    FEW WBC PRESENT,BOTH PMN AND MONONUCLEAR NO ORGANISMS SEEN Performed at [facility], [address] [place], [place] [address]    Culture   Final    RARE ESCHERICHIA COLI RARE METHICILLIN RESISTANT STAPHYLOCOCCUS AUREUS NO ANAEROBES ISOLATED; CULTURE IN PROGRESS FOR 5 DAYS    Report Status PENDING  Incomplete   Organism ID, Bacteria ESCHERICHIA COLI  Final   Organism ID, Bacteria METHICILLIN RESISTANT STAPHYLOCOCCUS AUREUS  Final      Susceptibility   Escherichia coli - MIC*    AMPICILLIN <=2 SENSITIVE Sensitive     CEFAZOLIN <=4 SENSITIVE Sensitive     CEFEPIME <=1 SENSITIVE Sensitive     CEFTAZIDIME <=1 SENSITIVE Sensitive     CEFTRIAXONE <=1 SENSITIVE Sensitive     CIPROFLOXACIN <=0.25 SENSITIVE Sensitive     GENTAMICIN <=1 SENSITIVE Sensitive     IMIPENEM <=0.25 SENSITIVE Sensitive  TRIMETH/SULFA <=20 SENSITIVE Sensitive     AMPICILLIN/SULBACTAM <=2 SENSITIVE Sensitive     PIP/TAZO <=4 SENSITIVE Sensitive     Extended ESBL NEGATIVE Sensitive     * RARE ESCHERICHIA COLI   Methicillin resistant staphylococcus aureus - MIC*     CIPROFLOXACIN >=8 RESISTANT Resistant     ERYTHROMYCIN >=8 RESISTANT Resistant     GENTAMICIN <=0.5 SENSITIVE Sensitive     OXACILLIN >=4 RESISTANT Resistant     TETRACYCLINE <=1 SENSITIVE Sensitive     VANCOMYCIN <=0.5 SENSITIVE Sensitive     TRIMETH/SULFA <=10 SENSITIVE Sensitive     CLINDAMYCIN <=0.25 SENSITIVE Sensitive     RIFAMPIN <=0.5 SENSITIVE Sensitive     Inducible Clindamycin NEGATIVE Sensitive     * RARE METHICILLIN RESISTANT STAPHYLOCOCCUS AUREUS  Body fluid culture     Status: None (Preliminary result)   Collection Time: 11/03/17  1:05 PM  Result Value Ref Range Status   Specimen Description FLUID PLEURAL LEFT  Final   Special Requests NONE  Final   Gram Stain   Final    RARE WBC PRESENT, PREDOMINANTLY PMN NO ORGANISMS SEEN    Culture   Final    NO GROWTH 3 DAYS Performed at [facility], [address], [place], [place] [address]    Report Status PENDING  Incomplete     Labs: Basic Metabolic Panel: Recent Labs  Lab 11/02/17 0246 11/03/17 0400 11/04/17 0412 11/05/17 0441 11/06/17 0501  NA 135 133* 136 137 137  K 4.1 4.3 4.2 4.2 3.7  CL 90* 96* 100 96* 101  CO2 34* 30 30 32 30  GLUCOSE 159* 241* 161* 141* 130*  BUN 7 14 15 10 8   CREATININE 0.76 0.71 0.85 0.85 0.75  CALCIUM 8.8* 8.4* 8.5* 8.8* 8.4*  MG  --   --   --   --  2.0   Liver Function Tests: Recent Labs  Lab 11/02/17 0246 11/04/17 0412 11/06/17 0501  AST 34 22 13*  ALT 71* 50* 28  ALKPHOS 150* 101 99  BILITOT 0.3 0.4 0.4  PROT 7.3 6.2* 6.6  ALBUMIN 2.2* 2.0* 2.2*   No results for input(s): LIPASE, AMYLASE in the last 168 hours. No results for input(s): AMMONIA in the last 168 hours. CBC: Recent Labs  Lab 11/02/17 0246 11/03/17 0400 11/04/17 0412 11/05/17 0441 11/06/17 0501  WBC 20.7* 22.2* 14.0* 12.6* 12.4*  HGB 11.5* 9.9* 10.0* 11.3* 10.1*  HCT 36.2 32.2* 31.9* 35.6* 33.2*  MCV 94.0 93.9 94.9 93.7 94.1  PLT 376 390 388 173 426*   Cardiac Enzymes: No results for  input(s): CKTOTAL, CKMB, CKMBINDEX, TROPONINI in the last 168 hours. BNP: BNP (last 3 results) No results for input(s): BNP in the last 8760 hours.  ProBNP (last 3 results) No results for input(s): PROBNP in the last 8760 hours.  CBG: Recent Labs  Lab 11/05/17 1300 11/05/17 1636 11/05/17 2051 11/06/17 0843 11/06/17 1217  GLUCAP 172* 160* 231* 138* 157*       Signed:  Florencia Reasons MD, PhD  [facility] 11/06/2017, 2:36 PM

## 2017-11-06 NOTE — Progress Notes (Signed)
Pt discharged via wheelchair, all belonging with patient, VSS, central line removed before my shift dsg in place clean, dry and intact. Prescription medications brought up by pharmacy and delivered to pt in room. DC instructions explained, all questions answered.

## 2017-11-06 NOTE — Progress Notes (Addendum)
      [address] WashingtonSuite [address]       [place] [address]             [phone]      4 Days Post-Op Procedure(s) (LRB): VIDEO ASSISTED THORACOSCOPY Right(VATS)/DRAINAGE EMPYEMA AND DECORticaATION. with Electronic Lawernce Keas done for count verification (Right) VIDEO BRONCHOSCOPY (N/A) Subjective: No issues this morning. She did have a low-grade fever last night which was treated with tylenol. No fevers since.   Objective: Vital signs in last 24 hours: Temp:  [98 F (36.7 C)-100.1 F (37.8 C)] 98 F (36.7 C) (10/17 0339) Pulse Rate:  [101] 101 (10/16 2107) Cardiac Rhythm: Normal sinus rhythm (10/17 1259) Resp:  [14-19] 14 (10/16 2047) BP: (114-127)/(62-69) 114/62 (10/17 0339) SpO2:  [94 %-95 %] 94 % (10/17 0051)     Intake/Output from previous day: 10/16 0701 - 10/17 0700 In: 756.7 [P.O.:620; IV Piggyback:136.7] Out: -  Intake/Output this shift: No intake/output data recorded.  General appearance: alert, cooperative and no distress Heart: regular rate and rhythm, S1, S2 normal, no murmur, click, rub or gallop Lungs: clear to auscultation bilaterally Abdomen: soft, non-tender; bowel sounds normal; no masses,  no organomegaly Extremities: extremities normal, atraumatic, no cyanosis or edema Wound: clean and dry  Lab Results: Recent Labs    11/05/17 0441 11/06/17 0501  WBC 12.6* 12.4*  HGB 11.3* 10.1*  HCT 35.6* 33.2*  PLT 173 426*   BMET:  Recent Labs    11/05/17 0441 11/06/17 0501  NA 137 137  K 4.2 3.7  CL 96* 101  CO2 32 30  GLUCOSE 141* 130*  BUN 10 8  CREATININE 0.85 0.75  CALCIUM 8.8* 8.4*    PT/INR: No results for input(s): LABPROT, INR in the last 72 hours. ABG    Component Value Date/Time   PHART 7.400 11/03/2017 0355   HCO3 32.1 (H) 11/03/2017 0355   O2SAT 92.2 11/03/2017 0355   CBG (last 3)  Recent Labs    11/05/17 1636 11/05/17 2051 11/06/17 0843  GLUCAP 160* 231* 138*    Assessment/Plan: S/P Procedure(s) (LRB): VIDEO  ASSISTED THORACOSCOPY Right(VATS)/DRAINAGE EMPYEMA AND DECORticaATION. with Electronic Lawernce Keas done for count verification (Right) VIDEO BRONCHOSCOPY (N/A)  1. CV-NSR in the 70s, BP well controlled. Continue Lopressor. 2. Pulm-tolerating room air with good oxygenation. Encouraged incentive spirometer. Chest tubes removed. CXR showed no pneumothorax, stable atelectasis and bilateral pleural effusions unchanged. 3. Renal-creatinine 0.75, electrolytes okay 4. Antibiotics-continue Vanc and Zosyn per Medicine-cultures pending. Bronchial washings from 10/13 showed MRSA.  5. H and H stable at10.1/33.2expected acute blood loss anemia  Plan: Incision healing well. Discharge today. PO Antibiotics. Will follow-up in 2 weeks in our office with a chest xray.    LOS: 5 days    [name] 11/06/2017 I have seen and examined Colleen Kim and agree with the above assessment  and plan.  Grace Isaac MD Beeper [phone] Office [phone] 11/06/2017 2:33 PM

## 2017-11-06 NOTE — Clinical Social Work Note (Signed)
Clinical Social Work Assessment  Patient Details  Name: Colleen Kim MRN: [id number] Date of Birth: 07/18/1975  Date of referral:  11/06/17               Reason for consult:  [employer] sought to share information with:    Permission granted to share information::  No  Name::        Agency::     Relationship::     Contact Information:     Housing/Transportation Living arrangements for the past 2 months:  Single Family Home Source of Information:  Patient, Medical Team Patient Interpreter Needed:  None Criminal Activity/Legal Involvement Pertinent to Current Situation/Hospitalization:  No - Comment as needed Significant Relationships:  Adult Children, Other Family Members Lives with:    Do you feel safe going back to the place where you live?  Yes Need for family participation in patient care:  Yes (Comment)  Care giving concerns:  Power off at home.   Social Worker assessment / plan:  CSW met with patient. No supports at bedside. CSW introduced role and inquired about resource needs. Provided Tourist information centre manager for [facility]. No further concerns. CSW signing off as social work intervention is no longer needed.  Employment status:  Unemployed Forensic scientist:  Self Pay (Medicaid Pending) PT Recommendations:  Not assessed at this time Information / Referral to community resources:  Other (Comment Required)(Community resources)  Patient/Family's Response to care:  Patient agreeable to receiving resources. Patient's family supportive and involved in patient's care. Patient appreciated social work intervention.  Patient/Family's Understanding of and Emotional Response to Diagnosis, Current Treatment, and Prognosis:  Patient has a good understanding of the reason for admission and social work consult. Patient appears happy with hospital care.  Emotional Assessment Appearance:  Appears stated age Attitude/Demeanor/Rapport:   Engaged, Gracious Affect (typically observed):  Accepting, Appropriate, Calm, Pleasant Orientation:  Oriented to Self, Oriented to Place, Oriented to  Time, Oriented to Situation Alcohol / Substance use:  Never Used Psych involvement (Current and /or in the community):  No (Comment)  Discharge Needs  Concerns to be addressed:  No discharge needs identified Readmission within the last 30 days:  No Current discharge risk:  None Barriers to Discharge:  Continued Medical Work up   Candie Chroman, LCSW 11/06/2017, 1:19 PM

## 2017-11-06 NOTE — Progress Notes (Signed)
RN noted temp 101 in a progress note overnight; however, T 100.1. Pt treated w/Tylenol.  Afebrile since.  CVTS and attending both aware.

## 2017-11-06 NOTE — Care Management Note (Addendum)
Case Management Note  Patient Details  Name: Colleen Kim MRN: [id number] Date of Birth: November 02, 1975  Subjective/Objective:   Pt admitted with empyema                 Action/Plan:  PTA independent from home with son.  Pt has appt with [facility] set up - CM instructed pt to request medication assistance via clinic pharmacy at appt.  Pt received MATCH and copay override.  [facility] will deliver meds to bed prior to discharge   Expected Discharge Date:  11/06/17               Expected Discharge Plan:  Home/Self Care  In-House Referral:  Clinical Social Work  Discharge planning Services  CM Consult, Follow-up appt scheduled, MATCH Program  Post Acute Care Choice:    Choice offered to:     DME Arranged:    DME Agency:     HH Arranged:    [place] Agency:     Status of Service:  Completed, signed off  If discussed at [employer], dates discussed:    Additional Comments:  Maryclare Labrador, RN 11/06/2017, 3:05 PM

## 2017-11-07 LAB — HEPATITIS PANEL, ACUTE
HEP B C IGM: NEGATIVE
HEP B S AG: NEGATIVE
Hep A IgM: NEGATIVE

## 2017-11-07 LAB — AEROBIC/ANAEROBIC CULTURE W GRAM STAIN (SURGICAL/DEEP WOUND): Culture: NO GROWTH

## 2017-11-07 LAB — BODY FLUID CULTURE: Culture: NO GROWTH

## 2017-11-07 LAB — AEROBIC/ANAEROBIC CULTURE (SURGICAL/DEEP WOUND)

## 2017-11-10 ENCOUNTER — Inpatient Hospital Stay: Payer: Self-pay | Admitting: Critical Care Medicine

## 2017-11-10 LAB — ACID FAST SMEAR (AFB, MYCOBACTERIA): Acid Fast Smear: NEGATIVE

## 2017-11-11 LAB — CULTURE, BLOOD (ROUTINE X 2)
Culture: NO GROWTH
Culture: NO GROWTH
Special Requests: ADEQUATE
Special Requests: ADEQUATE

## 2017-11-20 ENCOUNTER — Other Ambulatory Visit: Payer: Self-pay | Admitting: Cardiothoracic Surgery

## 2017-11-20 DIAGNOSIS — J869 Pyothorax without fistula: Secondary | ICD-10-CM

## 2017-11-23 LAB — CULTURE, FUNGUS WITHOUT SMEAR

## 2017-11-24 ENCOUNTER — Ambulatory Visit: Payer: Self-pay

## 2017-11-25 ENCOUNTER — Encounter: Payer: Self-pay | Admitting: Cardiothoracic Surgery

## 2017-12-05 LAB — FUNGUS CULTURE WITH STAIN

## 2017-12-05 LAB — FUNGUS CULTURE RESULT

## 2017-12-05 LAB — FUNGAL ORGANISM REFLEX

## 2017-12-10 LAB — FUNGAL ORGANISM REFLEX

## 2017-12-10 LAB — FUNGUS CULTURE WITH STAIN

## 2017-12-10 LAB — FUNGUS CULTURE RESULT

## 2017-12-16 LAB — ACID FAST CULTURE WITH REFLEXED SENSITIVITIES (MYCOBACTERIA): Acid Fast Culture: NEGATIVE

## 2017-12-22 LAB — ACID FAST CULTURE WITH REFLEXED SENSITIVITIES (MYCOBACTERIA): Acid Fast Culture: NEGATIVE

## 2017-12-31 LAB — ACID FAST CULTURE WITH REFLEXED SENSITIVITIES (MYCOBACTERIA): Acid Fast Culture: NEGATIVE

## 2020-07-03 IMAGING — CR DG CHEST 2V
2 series · 2 of 2 positions shown · non-contrast
Comparison: Chest x-ray 11/05/2017.

CLINICAL DATA: Thoracostomy with empyema drainage.

EXAM:
CHEST - 2 VIEW

[chest pa]
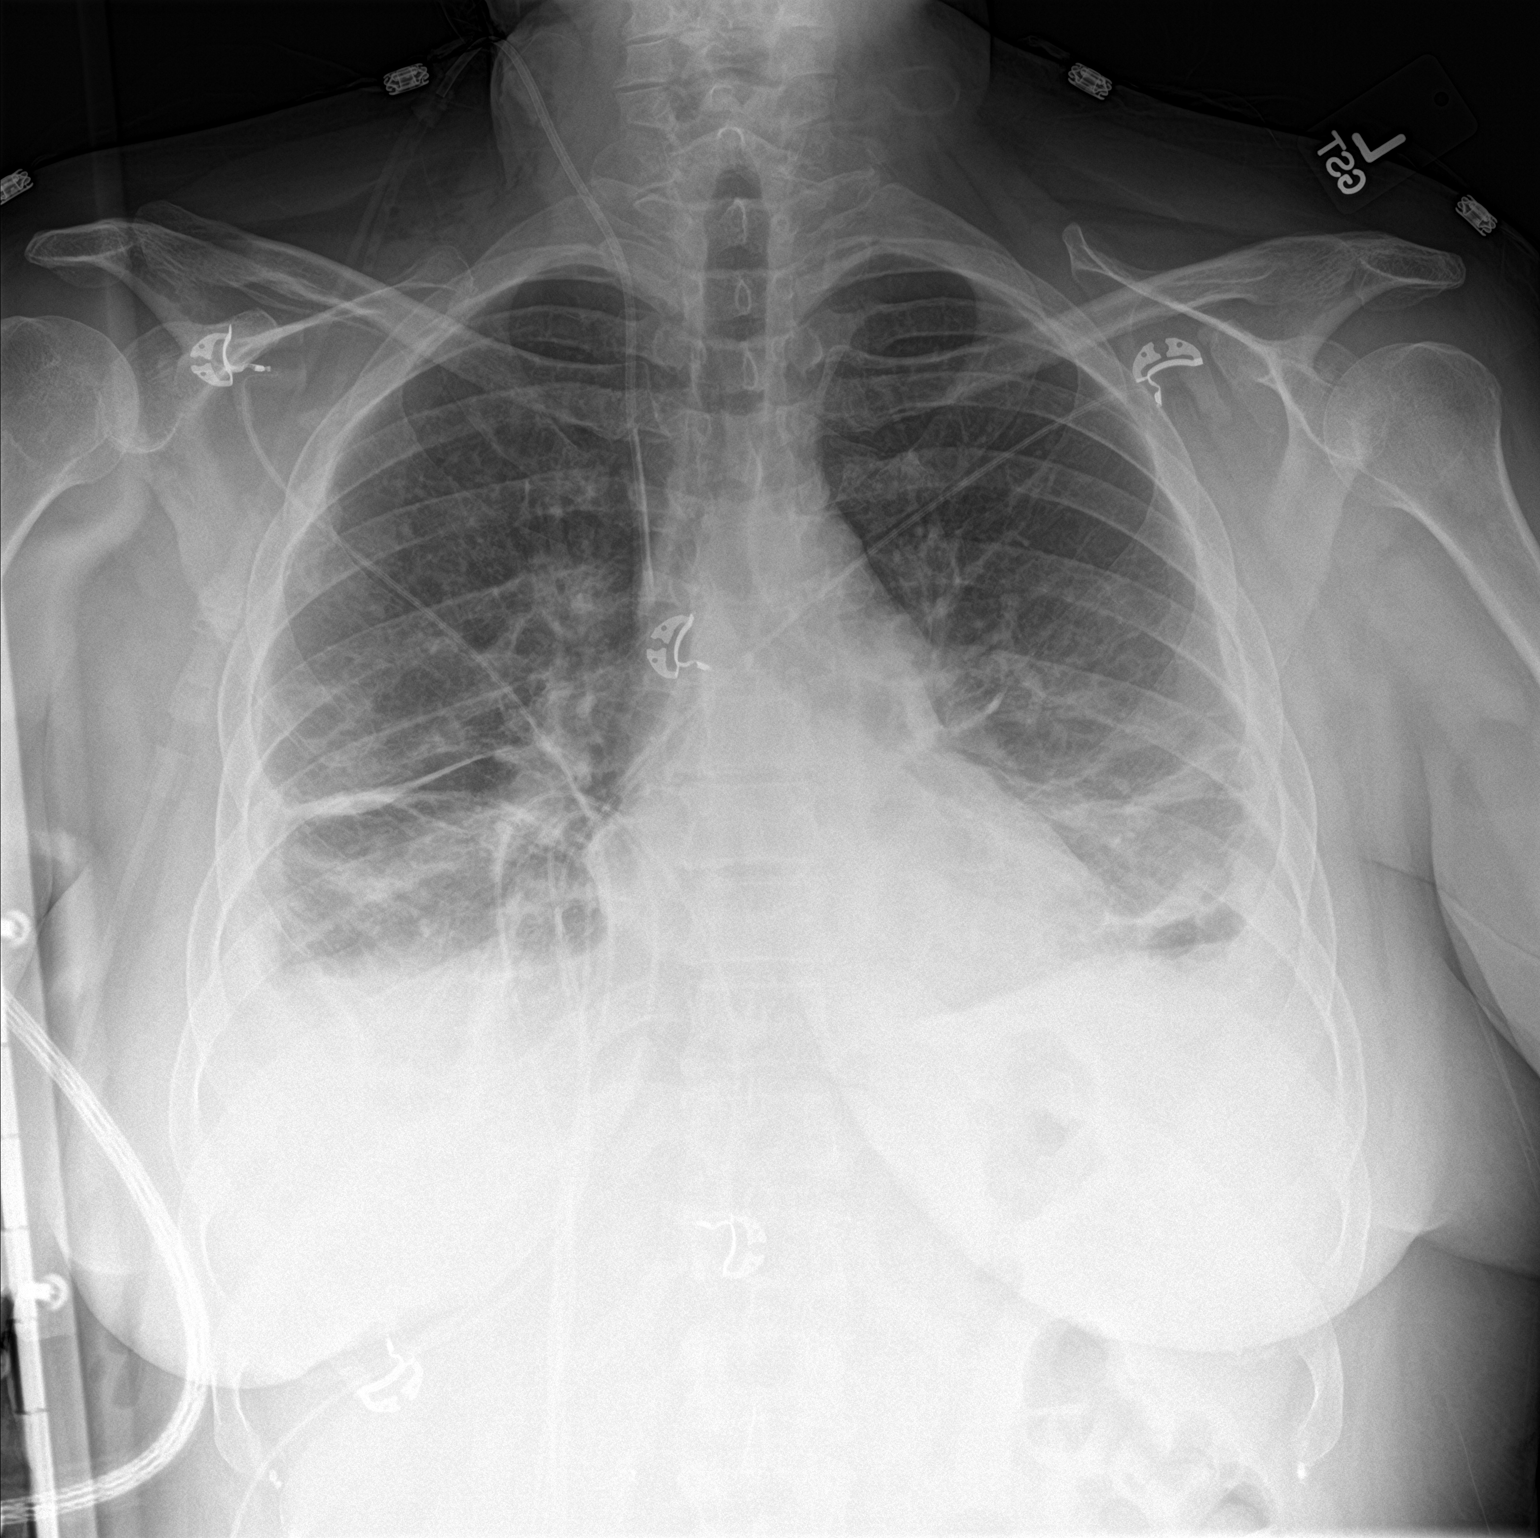

[chest lat]
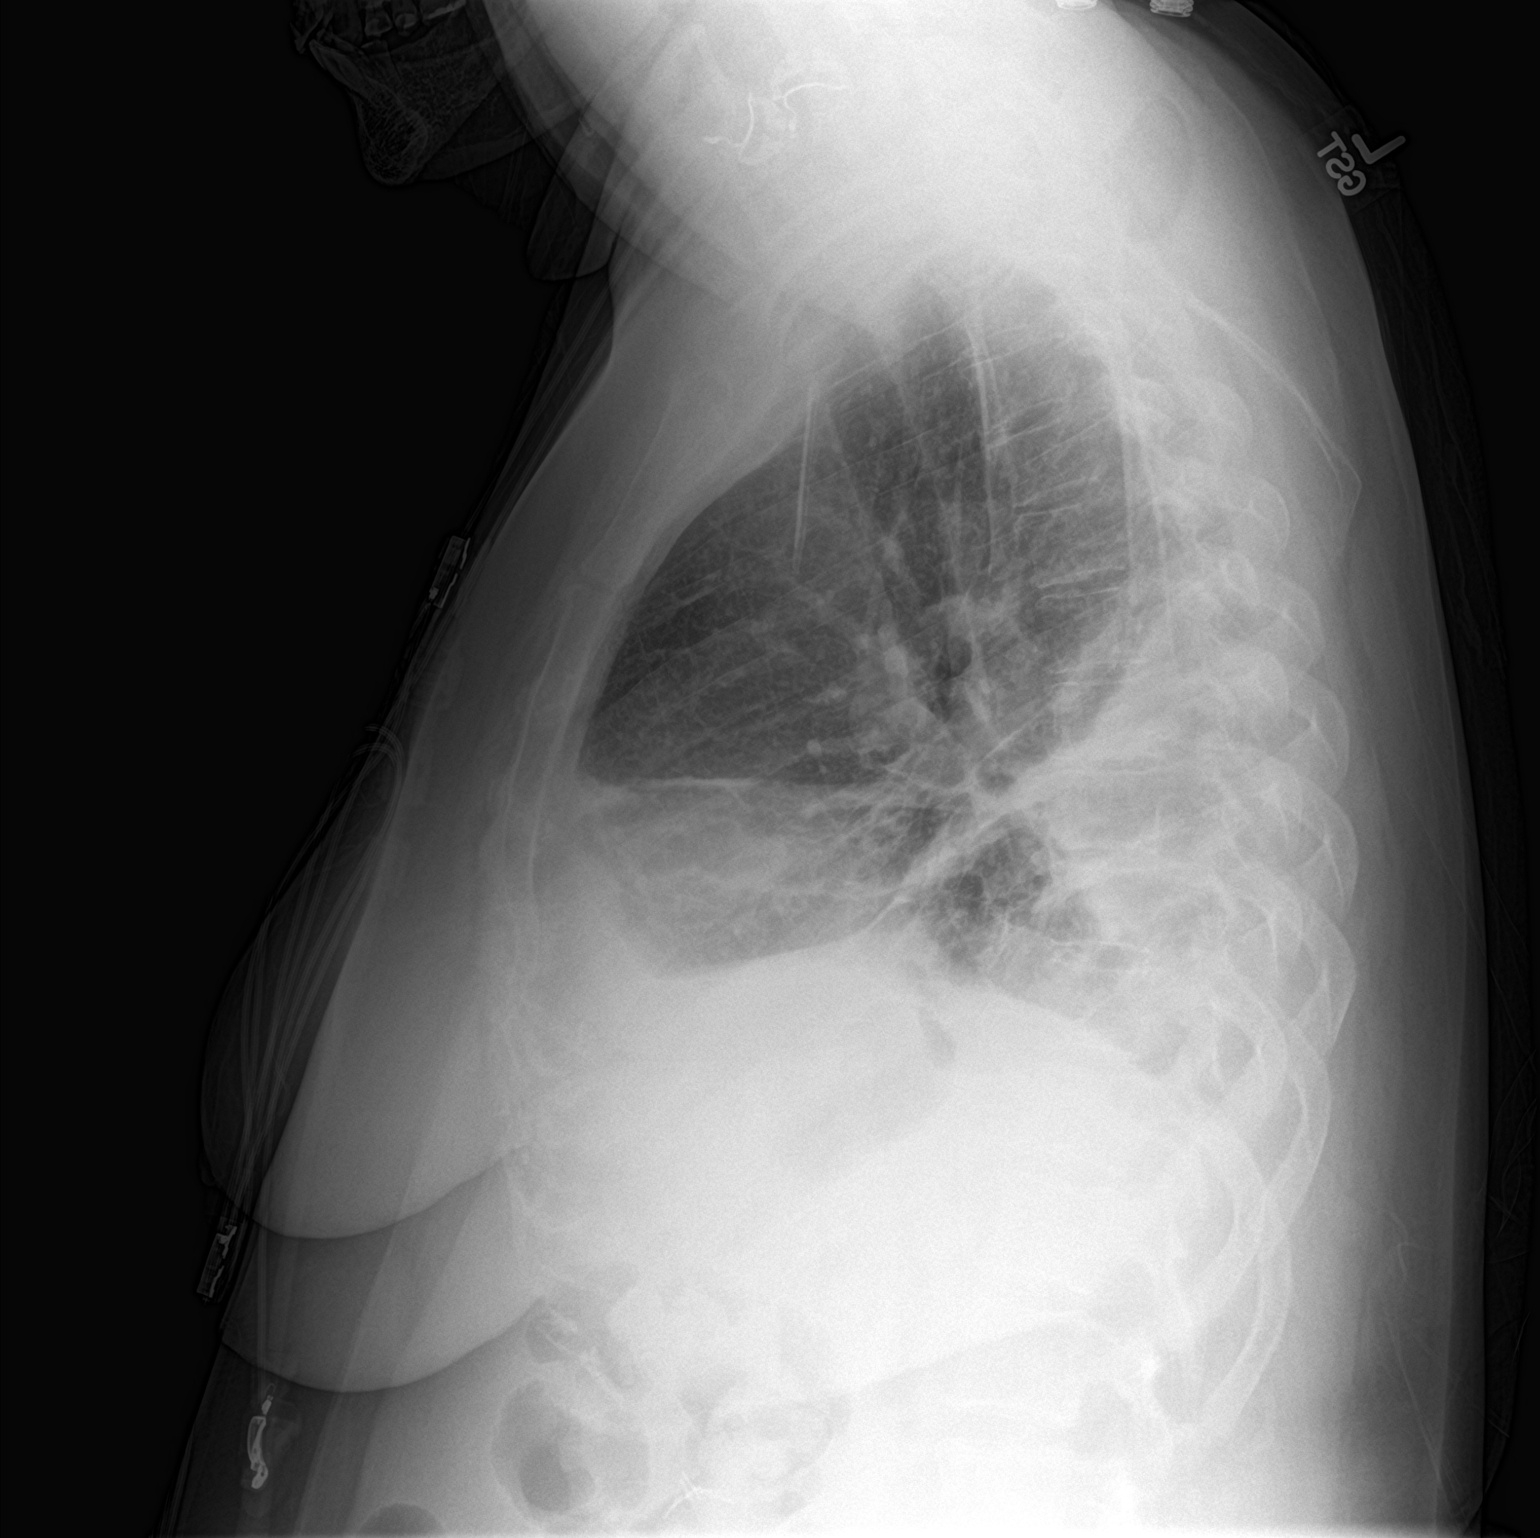

[2 of 2 positions shown; findings below may reference images not displayed]

FINDINGS: Right IJ line with tip over superior vena cava. Interim removal of
right chest tube. Bibasilar atelectasis/infiltrates. Stable
bilateral pleural effusions/pleural thickening, particularly
posteriorly on the right. No pneumothorax.
IMPRESSION: 1. Interim removal of right chest tube. No pneumothorax. Right IJ
line noted with tip over superior vena cava.

2. Stable bibasilar atelectasis/infiltrates and bilateral pleural
effusions particularly posteriorly on the right. No interim change
from prior exam.
